# Patient Record
Sex: Female | Born: 1955
Health system: Southern US, Community
[De-identification: ages and names within clinical notes are randomized; demographics above are authoritative.]

## PROBLEM LIST (undated history)

## (undated) DIAGNOSIS — J309 Allergic rhinitis, unspecified: Secondary | ICD-10-CM

## (undated) DIAGNOSIS — R7303 Prediabetes: Secondary | ICD-10-CM

## (undated) DIAGNOSIS — M8580 Other specified disorders of bone density and structure, unspecified site: Secondary | ICD-10-CM

## (undated) DIAGNOSIS — J449 Chronic obstructive pulmonary disease, unspecified: Secondary | ICD-10-CM

## (undated) DIAGNOSIS — I10 Essential (primary) hypertension: Secondary | ICD-10-CM

## (undated) DIAGNOSIS — E782 Mixed hyperlipidemia: Secondary | ICD-10-CM

## (undated) DIAGNOSIS — K219 Gastro-esophageal reflux disease without esophagitis: Secondary | ICD-10-CM

## (undated) HISTORY — PX: EYE SURGERY: SHX253

## (undated) HISTORY — PX: OTHER SURGICAL HISTORY: SHX169

## (undated) HISTORY — DX: Chronic obstructive pulmonary disease, unspecified: J44.9

## (undated) HISTORY — PX: BLADDER SUSPENSION: SHX72

## (undated) HISTORY — DX: Allergic rhinitis, unspecified: J30.9

## (undated) HISTORY — DX: Prediabetes: R73.03

## (undated) HISTORY — DX: Other specified disorders of bone density and structure, unspecified site: M85.80

## (undated) HISTORY — PX: LIPOMA EXCISION: SHX5283

## (undated) HISTORY — DX: Essential (primary) hypertension: I10

## (undated) HISTORY — PX: CHOLECYSTECTOMY: SHX55

## (undated) HISTORY — DX: Gastro-esophageal reflux disease without esophagitis: K21.9

## (undated) HISTORY — DX: Mixed hyperlipidemia: E78.2

---

## 1998-12-13 ENCOUNTER — Other Ambulatory Visit: Admission: RE | Admit: 1998-12-13 | Discharge: 1998-12-13 | Payer: Self-pay | Admitting: Family Medicine

## 1999-11-14 ENCOUNTER — Encounter: Payer: Self-pay | Admitting: Emergency Medicine

## 1999-11-15 ENCOUNTER — Inpatient Hospital Stay (HOSPITAL_COMMUNITY): Admission: EM | Admit: 1999-11-15 | Discharge: 1999-11-17 | Payer: Self-pay | Admitting: Emergency Medicine

## 1999-11-19 ENCOUNTER — Encounter: Admission: RE | Admit: 1999-11-19 | Discharge: 1999-11-19 | Payer: Self-pay | Admitting: Family Medicine

## 1999-11-25 ENCOUNTER — Encounter: Payer: Self-pay | Admitting: Emergency Medicine

## 1999-11-25 ENCOUNTER — Inpatient Hospital Stay (HOSPITAL_COMMUNITY): Admission: EM | Admit: 1999-11-25 | Discharge: 1999-11-29 | Payer: Self-pay | Admitting: Emergency Medicine

## 1999-11-27 ENCOUNTER — Encounter: Payer: Self-pay | Admitting: Family Medicine

## 2004-12-26 ENCOUNTER — Other Ambulatory Visit: Admission: RE | Admit: 2004-12-26 | Discharge: 2004-12-26 | Payer: Self-pay | Admitting: Family Medicine

## 2005-02-07 ENCOUNTER — Emergency Department: Payer: Self-pay | Admitting: General Practice

## 2005-06-17 ENCOUNTER — Ambulatory Visit: Payer: Self-pay | Admitting: Internal Medicine

## 2005-06-25 ENCOUNTER — Ambulatory Visit (HOSPITAL_COMMUNITY): Admission: RE | Admit: 2005-06-25 | Discharge: 2005-06-25 | Payer: Self-pay | Admitting: Internal Medicine

## 2005-07-01 ENCOUNTER — Ambulatory Visit: Payer: Self-pay | Admitting: Internal Medicine

## 2005-07-03 ENCOUNTER — Ambulatory Visit: Payer: Self-pay | Admitting: Internal Medicine

## 2005-08-26 ENCOUNTER — Ambulatory Visit: Payer: Self-pay | Admitting: Internal Medicine

## 2005-08-28 ENCOUNTER — Ambulatory Visit: Payer: Self-pay | Admitting: Internal Medicine

## 2005-10-02 ENCOUNTER — Ambulatory Visit: Payer: Self-pay | Admitting: Internal Medicine

## 2005-10-29 ENCOUNTER — Ambulatory Visit: Payer: Self-pay | Admitting: Internal Medicine

## 2005-12-11 ENCOUNTER — Ambulatory Visit: Payer: Self-pay | Admitting: Internal Medicine

## 2006-01-18 ENCOUNTER — Ambulatory Visit: Payer: Self-pay | Admitting: Internal Medicine

## 2006-03-19 ENCOUNTER — Ambulatory Visit: Payer: Self-pay | Admitting: Internal Medicine

## 2006-07-29 ENCOUNTER — Ambulatory Visit: Payer: Self-pay | Admitting: Internal Medicine

## 2006-08-20 ENCOUNTER — Ambulatory Visit: Payer: Self-pay | Admitting: Internal Medicine

## 2006-09-20 ENCOUNTER — Ambulatory Visit: Payer: Self-pay | Admitting: Internal Medicine

## 2006-11-30 ENCOUNTER — Ambulatory Visit: Payer: Self-pay | Admitting: Internal Medicine

## 2006-12-18 ENCOUNTER — Other Ambulatory Visit: Payer: Self-pay

## 2006-12-19 ENCOUNTER — Inpatient Hospital Stay: Payer: Self-pay | Admitting: Internal Medicine

## 2006-12-20 ENCOUNTER — Other Ambulatory Visit: Payer: Self-pay

## 2007-03-17 ENCOUNTER — Ambulatory Visit: Payer: Self-pay | Admitting: Internal Medicine

## 2007-08-10 ENCOUNTER — Ambulatory Visit: Payer: Self-pay | Admitting: Internal Medicine

## 2007-11-07 ENCOUNTER — Encounter: Payer: Self-pay | Admitting: Internal Medicine

## 2008-01-23 ENCOUNTER — Encounter: Payer: Self-pay | Admitting: Internal Medicine

## 2008-02-15 ENCOUNTER — Ambulatory Visit: Payer: Self-pay | Admitting: Unknown Physician Specialty

## 2008-02-15 ENCOUNTER — Other Ambulatory Visit: Payer: Self-pay

## 2009-01-02 ENCOUNTER — Encounter: Payer: Self-pay | Admitting: Internal Medicine

## 2009-06-19 ENCOUNTER — Ambulatory Visit: Payer: Self-pay | Admitting: Family Medicine

## 2009-07-10 ENCOUNTER — Ambulatory Visit: Payer: Self-pay | Admitting: Gastroenterology

## 2009-11-15 ENCOUNTER — Telehealth (INDEPENDENT_AMBULATORY_CARE_PROVIDER_SITE_OTHER): Payer: Self-pay | Admitting: *Deleted

## 2010-11-01 ENCOUNTER — Inpatient Hospital Stay: Payer: Self-pay | Admitting: Internal Medicine

## 2010-11-06 LAB — PATHOLOGY REPORT

## 2010-11-25 NOTE — Progress Notes (Signed)
Summary: Record request  Request for records received from [facility] [place]. Request forwarded to [employer]. Wilder Glade  November 15, 2009 4:13 PM

## 2011-03-10 NOTE — Assessment & Plan Note (Signed)
[facility]                             PULMONARY OFFICE NOTE   Angela Pitts, Angela Pitts                   MRN:          [id number]  DATE:03/17/2007                            DOB:          April 22, 1956    PROBLEM:  1. Asthmatic bronchitis/chronic obstructive pulmonary disease.  2. Allergic asthma.  3. Allergic rhinitis.  4. Occupational asthma.   HISTORY:  Continues same job.  Remains off of cigarettes.  Hospitalized  at [place] 6 weeks ago with chest pain, for which he says was blamed on  COPD.  MI was ruled out, including heart catheterization.  She says  pollen makes her sneeze, increases chest congestion.  She was changed  from Advair to Symbicort for trial.  Overall, she feels better.  Has a  left parasternal, sharp point tenderness that she can identify with one  fingertip, associated only with forced deep breath.  Chest x-ray from  February had shown no acute pulmonary findings, mild interstitial  coarsening, which I told her was consistent with bronchitis.   MEDICATIONS:  1. Singulair 10 mg.  2. Spiriva.  3. Nasonex.  4. Zyrtec 10 mg.  5. Allergy vaccines per Dr. Harlene Salts office has continued at 1-10.  6. Oxygen 2 liters at h.s.  7. Home nebulizer with DuoNeb.  8. She is still using prednisone 10 mg daily for maintenance.  9. Lexapro 10 mg.  10.Symbicort 160/4.5.  11.Isorbid 30 mg.  12.Pro-Air rescue inhaler as available p.r.n.  13.Aciphex.  14.Nitroglycerin.   MEDICATION ALLERGIES:  NO MEDICATION ALLERGY.   OBJECTIVE:  VITAL SIGNS:  Weight 200 pounds, BP 106/70, pulse 74, room  air saturation 94%, mild congestion with diminished breath sounds.  HEART:  Sounds are normal.  HEENT:  Mild nasal stuffiness.   IMPRESSION:  1. Asthma with chronic obstructive pulmonary disease.  2. Current status is fair.  I think there is a seasonal pollen      exacerbation to some rhinitis.  The left parasternal pain is      costochondritis,  probably related to hard coughing, when she was      hospitalized a few weeks ago.   PLAN:  Environmental precautions including discussion about what she is  going to do about her job.  Continue prednisone at 10 mg daily for now,  as a way of trying to keep her functional, but we discussed steroid  issues again.  Schedule return in 3 months, earlier p.r.n.     Clinton D. Maple Hudson, MD, [facility], FACP  Electronically Signed    CDY/MedQ  DD: 03/20/2007  DT: 03/20/2007  Job #: [id number]   cc:   Lianne Bushy, M.D.

## 2011-03-13 NOTE — Assessment & Plan Note (Signed)
[facility]                             PULMONARY OFFICE NOTE   Angela Pitts, Angela Pitts                   MRN:          [id number]  DATE:12/03/2006                            DOB:          11/08/1955    PROBLEM:  1. Asthmatic bronchitis/chronic obstructive pulmonary disease.  2. Allergic asthma.  3. Allergic rhinitis.  4. Occupational asthma.   HISTORY:  She had GI upset and bronchitis a week or 2 ago with hard  coughing, and noted a streak of hemoptysis at that time.  That is now  substantially resolved.  There has been no epistaxis, no other bleeding,  no ankle edema or chest pain.  She continues allergy vaccine at 1:50  given at Dr. Harlene Salts office.  She always wheezes.  She has not been  successful at finding a different job, although we have discussed the  impact of her workplace on her breathing.   MEDICATIONS:  1. Advair 250/50.  2. Singulair 10 mg.  3. Spiriva.  4. Nasonex.  5. Zyrtec 10 mg.  6. Allergy vaccine.  7. Oxygen 2 L at night.  8. Home nebulizer with DuoNeb.  9. Rescue albuterol inhaler.  10.AcipHex p.r.n. use.   No medication allergy.   She has remained off of cigarettes.   OBJECTIVE:  Weight 192 pounds, BP 110/80, pulse regular 100, room air  saturation 91%.  Diffuse wheeze, unlabored.  Heart sounds regular without murmur.  No neck vein distension or edema.  No cyanosis, no adenopathy.   IMPRESSION:  Asthmatic bronchitis/chronic obstructive pulmonary disease  with recent exacerbation that sounds like a viral episode on top of her  baseline chronic asthmatic bronchitis.  I have again discussed job  change or disability application.   PLAN:  Chest x-ray.  Maintenance prednisone 10 mg daily for a trial with  steroid talk.  Schedule return 3 months.  Consider then advancing  vaccine to 1:10.     Clinton D. Maple Hudson, MD, [facility], FACP  Electronically Signed    CDY/MedQ  DD: 12/04/2006  DT: 12/04/2006  Job #:  [id number]   cc:   Lianne Bushy, M.D.

## 2011-03-13 NOTE — Assessment & Plan Note (Signed)
[facility]                               PULMONARY OFFICE NOTE   Angela, Pitts                   MRN:          [id number]  DATE:08/20/2006                            DOB:          1955/12/12    PULMONARY FOLLOWUP   PROBLEMS:  1. Asthmatic bronchitis/chronic obstructive pulmonary disease.  2. Allergic asthma.  3. Allergic rhinitis.  4. Occupational asthma.   HISTORY:  Has had flu shot.  Off of prednisone.  Got a local reddening at  her allergy shot injection site last time, placed unusually far back and low  on the triceps area.  Previous shots have been over the deltoid with no  problems at same dose.  This is resolving.  She always has some nasal and  chest congestion, does not feel she has a cold.  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  I have again  emphasized the dominant importance of respiratory irritants in keeping her  problems going.   MEDICATIONS:  1. Advair 250/50.  2. Singulair 10 mg.  3. Spiriva.  4. Nasonex.  5. Zyrtec.  6. Allergy vaccine.  7. Oxygen 2L used at night.  8. Home nebulizer with DuoNeb used 2 or 3 times a week.  9. Rescue albuterol inhaler.  10.Occasional AcipHex.   OBJECTIVE:  Weight 199 pounds.  BP 120/76, pulse regular 80, room air  saturation 91%.  There is moderate nasal congestion with clear secretion.  Chest congestion with cough.  Unlabored, no dullness.  Heart sounds are regular.   IMPRESSION:  1. Asthmatic bronchitis/chronic obstructive pulmonary disease with an      occupational asthma component.  2. Local reaction to allergy vaccine.  We have discussed management.  I am      going to make sure she is stable at 1:50 before we consider advancing      her strength.  3. Nasal congestion and rhinitis, at least partly irritant and partly      allergic.   PLAN:  1. Nebulizer treatment here, Xopenex 1.25 mg, Depo-Medrol 80 mg IM.  2.  Hold vaccine at 1:50.  Rotate injection site.  Watch technique and      watch for further local      reaction.  3. Schedule return in 3 months, earlier p.r.n.     Clinton D. Maple Hudson, MD, [facility], FACP    CDY/MedQ  DD: 08/22/2006  DT: 08/23/2006  Job #: [id number]   cc:   Lianne Bushy, M.D.

## 2011-03-13 NOTE — Assessment & Plan Note (Signed)
Angela Pitts                               PULMONARY OFFICE NOTE   Angela, [name]                   MRN:          [id number]  DATE:07/29/2006                            DOB:          November 27, 1955    PROBLEM LIST:  1. Asthmatic bronchitis/chronic obstructive pulmonary disease.  2. Allergic asthma.  3. Allergic rhinitis.  4. Occupational asthma.   HISTORY OF PRESENT ILLNESS:  She is not smoking but has pretty constant  second hand smoke exposure around the family and co-workers. In the last 2  weeks, she has had increased nasal congestion with a white discharge. She  coughs until she blows her nose and gets this cleared out and feels she is  doing quite well. She continues oxygen 2 liters at night with regular use of  Nasonex and Zyrtec. She is working full time and pretty definitely  recognizes that her cough is better when she is away from the work place.  She is still looking for an alternative job and volunteers that her cough is  worse by the end of each work week and begins to get better over the  weekends. They let her take her nebulizer machine to work, which is a help  because it lets her keep working. She is getting her allergy vaccine once a  week at 1 to 50 at Dr. Harlene Pitts office, with no problems at all.   MEDICATIONS:  Advair 250/50, Singulair 10 mg, Spiriva, Nasonex, Zyrtec 10  mg, allergy vaccine, oxygen at 2 liters, Duo-Neb via nebulizer 2 to 3 times  a week p.r.n. up to q.i.d. p.r.n., rescue albuterol inhaler, Aciphex 20 mg  p.r.n. She has been on maintenance prednisone and I realized today that I am  not clear, as I did not ask while she was here, whether she is still doing  that at 10 mg daily.   OBJECTIVE:  VITAL SIGNS:  Weight 203 pounds. Blood pressure 122/82, pulse  regular at 79. Room air saturation 955.  LUNGS:  Inspiratory and expiratory wheezy congested cough, unlabored. No  stridor.  HEART:  Sounds regular  without murmur or gallop.  HEENT:  Turbinate edema.  EXTREMITIES:  No peripheral edema or cyanosis.   IMPRESSION:  Allergic and occupational rhinitis and asthma with chronic  obstructive pulmonary disease.   PLAN:  1. Continued effort to avoid respiratory irritants and the occupational      fumes. I have encouraged her to try a little harder to find herself an      alternative job. Continue vaccine at dose advance to 1 to 10.      Laboratory, will send instructions to Dr. Harlene Pitts office.  2. Chest x-ray.  3. Flu vaccine is given today.  4. Schedule return in 6 months, earlier p.r.n.       Angela D. Maple Hudson, MD, [facility], FACP      CDY/MedQ  DD:  07/29/2006  DT:  08/01/2006  Job #:  [id number]   cc:   Angela Pitts, M.D.

## 2014-11-17 DIAGNOSIS — J209 Acute bronchitis, unspecified: Secondary | ICD-10-CM | POA: Diagnosis not present

## 2014-12-18 DIAGNOSIS — J209 Acute bronchitis, unspecified: Secondary | ICD-10-CM | POA: Diagnosis not present

## 2015-01-16 DIAGNOSIS — J209 Acute bronchitis, unspecified: Secondary | ICD-10-CM | POA: Diagnosis not present

## 2015-02-16 DIAGNOSIS — J209 Acute bronchitis, unspecified: Secondary | ICD-10-CM | POA: Diagnosis not present

## 2015-03-18 DIAGNOSIS — J209 Acute bronchitis, unspecified: Secondary | ICD-10-CM | POA: Diagnosis not present

## 2015-04-04 DIAGNOSIS — M25561 Pain in right knee: Secondary | ICD-10-CM | POA: Diagnosis not present

## 2015-04-04 DIAGNOSIS — J449 Chronic obstructive pulmonary disease, unspecified: Secondary | ICD-10-CM | POA: Diagnosis not present

## 2015-04-04 DIAGNOSIS — E119 Type 2 diabetes mellitus without complications: Secondary | ICD-10-CM | POA: Diagnosis not present

## 2015-04-04 DIAGNOSIS — M1711 Unilateral primary osteoarthritis, right knee: Secondary | ICD-10-CM | POA: Diagnosis not present

## 2015-04-04 DIAGNOSIS — I10 Essential (primary) hypertension: Secondary | ICD-10-CM | POA: Diagnosis not present

## 2015-04-18 DIAGNOSIS — J209 Acute bronchitis, unspecified: Secondary | ICD-10-CM | POA: Diagnosis not present

## 2015-05-08 DIAGNOSIS — R7309 Other abnormal glucose: Secondary | ICD-10-CM | POA: Diagnosis not present

## 2015-05-08 DIAGNOSIS — Z6841 Body Mass Index (BMI) 40.0 and over, adult: Secondary | ICD-10-CM | POA: Diagnosis not present

## 2015-05-08 DIAGNOSIS — E782 Mixed hyperlipidemia: Secondary | ICD-10-CM | POA: Diagnosis not present

## 2015-05-08 DIAGNOSIS — Z720 Tobacco use: Secondary | ICD-10-CM | POA: Diagnosis not present

## 2015-05-08 DIAGNOSIS — J309 Allergic rhinitis, unspecified: Secondary | ICD-10-CM | POA: Diagnosis not present

## 2015-05-08 DIAGNOSIS — J449 Chronic obstructive pulmonary disease, unspecified: Secondary | ICD-10-CM | POA: Diagnosis not present

## 2015-05-08 DIAGNOSIS — I10 Essential (primary) hypertension: Secondary | ICD-10-CM | POA: Diagnosis not present

## 2015-05-18 DIAGNOSIS — J209 Acute bronchitis, unspecified: Secondary | ICD-10-CM | POA: Diagnosis not present

## 2015-06-06 ENCOUNTER — Telehealth: Payer: Self-pay

## 2015-06-06 DIAGNOSIS — Z124 Encounter for screening for malignant neoplasm of cervix: Secondary | ICD-10-CM | POA: Diagnosis not present

## 2015-06-06 DIAGNOSIS — J449 Chronic obstructive pulmonary disease, unspecified: Secondary | ICD-10-CM | POA: Diagnosis not present

## 2015-06-06 DIAGNOSIS — Z0000 Encounter for general adult medical examination without abnormal findings: Secondary | ICD-10-CM | POA: Diagnosis not present

## 2015-06-06 DIAGNOSIS — Z720 Tobacco use: Secondary | ICD-10-CM | POA: Diagnosis not present

## 2015-06-06 DIAGNOSIS — Z6841 Body Mass Index (BMI) 40.0 and over, adult: Secondary | ICD-10-CM | POA: Diagnosis not present

## 2015-06-06 DIAGNOSIS — K5900 Constipation, unspecified: Secondary | ICD-10-CM | POA: Diagnosis not present

## 2015-06-06 NOTE — Telephone Encounter (Signed)
Called and spoke with Vaughan Basta from [facility] her that there was availability at [place] location on 06/18/15. Pt would need to arrive at 10:15 for 10:30 appt with PW Per Vaughan Basta, she stated that pt was still at their office and that it was ok for pt to be scheduled at this time. Vaughan Basta also stated that she would be faxing office notes to [place] office for review  Pt has been scheduled  Nothing further is needed at this time

## 2015-06-10 DIAGNOSIS — K219 Gastro-esophageal reflux disease without esophagitis: Secondary | ICD-10-CM | POA: Diagnosis not present

## 2015-06-10 DIAGNOSIS — J309 Allergic rhinitis, unspecified: Secondary | ICD-10-CM | POA: Diagnosis not present

## 2015-06-10 DIAGNOSIS — J449 Chronic obstructive pulmonary disease, unspecified: Secondary | ICD-10-CM | POA: Diagnosis not present

## 2015-06-10 DIAGNOSIS — I10 Essential (primary) hypertension: Secondary | ICD-10-CM | POA: Diagnosis not present

## 2015-06-17 ENCOUNTER — Encounter: Payer: Self-pay | Admitting: Critical Care Medicine

## 2015-06-18 ENCOUNTER — Institutional Professional Consult (permissible substitution): Payer: Self-pay | Admitting: Critical Care Medicine

## 2015-06-18 DIAGNOSIS — J209 Acute bronchitis, unspecified: Secondary | ICD-10-CM | POA: Diagnosis not present

## 2015-07-02 DIAGNOSIS — Z1231 Encounter for screening mammogram for malignant neoplasm of breast: Secondary | ICD-10-CM | POA: Diagnosis not present

## 2015-07-19 DIAGNOSIS — J209 Acute bronchitis, unspecified: Secondary | ICD-10-CM | POA: Diagnosis not present

## 2015-08-18 DIAGNOSIS — J209 Acute bronchitis, unspecified: Secondary | ICD-10-CM | POA: Diagnosis not present

## 2015-09-02 ENCOUNTER — Encounter: Payer: Self-pay | Admitting: Internal Medicine

## 2015-09-18 DIAGNOSIS — J209 Acute bronchitis, unspecified: Secondary | ICD-10-CM | POA: Diagnosis not present

## 2015-10-03 ENCOUNTER — Encounter: Payer: Self-pay | Admitting: Internal Medicine

## 2015-10-18 DIAGNOSIS — J209 Acute bronchitis, unspecified: Secondary | ICD-10-CM | POA: Diagnosis not present

## 2015-11-18 DIAGNOSIS — J209 Acute bronchitis, unspecified: Secondary | ICD-10-CM | POA: Diagnosis not present

## 2015-12-19 DIAGNOSIS — J209 Acute bronchitis, unspecified: Secondary | ICD-10-CM | POA: Diagnosis not present

## 2016-01-16 DIAGNOSIS — J209 Acute bronchitis, unspecified: Secondary | ICD-10-CM | POA: Diagnosis not present

## 2016-02-16 DIAGNOSIS — J209 Acute bronchitis, unspecified: Secondary | ICD-10-CM | POA: Diagnosis not present

## 2016-03-17 DIAGNOSIS — J209 Acute bronchitis, unspecified: Secondary | ICD-10-CM | POA: Diagnosis not present

## 2016-03-24 DIAGNOSIS — Z79899 Other long term (current) drug therapy: Secondary | ICD-10-CM | POA: Diagnosis not present

## 2016-03-24 DIAGNOSIS — Z6841 Body Mass Index (BMI) 40.0 and over, adult: Secondary | ICD-10-CM | POA: Diagnosis not present

## 2016-03-24 DIAGNOSIS — K219 Gastro-esophageal reflux disease without esophagitis: Secondary | ICD-10-CM | POA: Diagnosis not present

## 2016-03-24 DIAGNOSIS — E782 Mixed hyperlipidemia: Secondary | ICD-10-CM | POA: Diagnosis not present

## 2016-03-24 DIAGNOSIS — I10 Essential (primary) hypertension: Secondary | ICD-10-CM | POA: Diagnosis not present

## 2016-03-24 DIAGNOSIS — Z87891 Personal history of nicotine dependence: Secondary | ICD-10-CM | POA: Diagnosis not present

## 2016-03-24 DIAGNOSIS — R7303 Prediabetes: Secondary | ICD-10-CM | POA: Diagnosis not present

## 2016-03-24 DIAGNOSIS — Z1389 Encounter for screening for other disorder: Secondary | ICD-10-CM | POA: Diagnosis not present

## 2016-03-24 DIAGNOSIS — J449 Chronic obstructive pulmonary disease, unspecified: Secondary | ICD-10-CM | POA: Diagnosis not present

## 2016-03-24 DIAGNOSIS — E2839 Other primary ovarian failure: Secondary | ICD-10-CM | POA: Diagnosis not present

## 2016-04-03 DIAGNOSIS — M8580 Other specified disorders of bone density and structure, unspecified site: Secondary | ICD-10-CM | POA: Diagnosis not present

## 2016-04-03 DIAGNOSIS — M8588 Other specified disorders of bone density and structure, other site: Secondary | ICD-10-CM | POA: Diagnosis not present

## 2016-04-03 DIAGNOSIS — E2839 Other primary ovarian failure: Secondary | ICD-10-CM | POA: Diagnosis not present

## 2016-04-06 DIAGNOSIS — R918 Other nonspecific abnormal finding of lung field: Secondary | ICD-10-CM | POA: Diagnosis not present

## 2016-04-06 DIAGNOSIS — Z87891 Personal history of nicotine dependence: Secondary | ICD-10-CM | POA: Diagnosis not present

## 2016-04-14 DIAGNOSIS — E875 Hyperkalemia: Secondary | ICD-10-CM | POA: Diagnosis not present

## 2016-04-17 DIAGNOSIS — J209 Acute bronchitis, unspecified: Secondary | ICD-10-CM | POA: Diagnosis not present

## 2016-04-29 DIAGNOSIS — J449 Chronic obstructive pulmonary disease, unspecified: Secondary | ICD-10-CM | POA: Diagnosis not present

## 2016-04-29 DIAGNOSIS — R918 Other nonspecific abnormal finding of lung field: Secondary | ICD-10-CM | POA: Diagnosis not present

## 2016-04-29 DIAGNOSIS — R0602 Shortness of breath: Secondary | ICD-10-CM | POA: Diagnosis not present

## 2016-04-29 DIAGNOSIS — J479 Bronchiectasis, uncomplicated: Secondary | ICD-10-CM | POA: Diagnosis not present

## 2016-05-04 DIAGNOSIS — R918 Other nonspecific abnormal finding of lung field: Secondary | ICD-10-CM | POA: Diagnosis not present

## 2016-05-04 DIAGNOSIS — R05 Cough: Secondary | ICD-10-CM | POA: Diagnosis not present

## 2016-05-04 DIAGNOSIS — R0602 Shortness of breath: Secondary | ICD-10-CM | POA: Diagnosis not present

## 2016-05-17 DIAGNOSIS — J209 Acute bronchitis, unspecified: Secondary | ICD-10-CM | POA: Diagnosis not present

## 2016-06-17 DIAGNOSIS — J209 Acute bronchitis, unspecified: Secondary | ICD-10-CM | POA: Diagnosis not present

## 2016-07-18 DIAGNOSIS — J209 Acute bronchitis, unspecified: Secondary | ICD-10-CM | POA: Diagnosis not present

## 2016-08-17 DIAGNOSIS — J209 Acute bronchitis, unspecified: Secondary | ICD-10-CM | POA: Diagnosis not present

## 2016-08-18 DIAGNOSIS — Z1231 Encounter for screening mammogram for malignant neoplasm of breast: Secondary | ICD-10-CM | POA: Diagnosis not present

## 2016-09-17 DIAGNOSIS — J209 Acute bronchitis, unspecified: Secondary | ICD-10-CM | POA: Diagnosis not present

## 2016-10-17 DIAGNOSIS — J209 Acute bronchitis, unspecified: Secondary | ICD-10-CM | POA: Diagnosis not present

## 2016-11-03 ENCOUNTER — Other Ambulatory Visit: Payer: Self-pay | Admitting: Specialist

## 2016-11-03 DIAGNOSIS — R05 Cough: Secondary | ICD-10-CM | POA: Diagnosis not present

## 2016-11-03 DIAGNOSIS — R918 Other nonspecific abnormal finding of lung field: Secondary | ICD-10-CM

## 2016-11-03 DIAGNOSIS — R059 Cough, unspecified: Secondary | ICD-10-CM

## 2016-11-03 DIAGNOSIS — Z6841 Body Mass Index (BMI) 40.0 and over, adult: Secondary | ICD-10-CM | POA: Diagnosis not present

## 2016-11-03 DIAGNOSIS — J439 Emphysema, unspecified: Secondary | ICD-10-CM | POA: Diagnosis not present

## 2016-11-03 DIAGNOSIS — J310 Chronic rhinitis: Secondary | ICD-10-CM | POA: Diagnosis not present

## 2016-11-17 DIAGNOSIS — J209 Acute bronchitis, unspecified: Secondary | ICD-10-CM | POA: Diagnosis not present

## 2016-12-18 DIAGNOSIS — J209 Acute bronchitis, unspecified: Secondary | ICD-10-CM | POA: Diagnosis not present

## 2017-01-15 DIAGNOSIS — J209 Acute bronchitis, unspecified: Secondary | ICD-10-CM | POA: Diagnosis not present

## 2017-01-31 DIAGNOSIS — Z9981 Dependence on supplemental oxygen: Secondary | ICD-10-CM | POA: Diagnosis not present

## 2017-01-31 DIAGNOSIS — R531 Weakness: Secondary | ICD-10-CM | POA: Diagnosis not present

## 2017-01-31 DIAGNOSIS — J441 Chronic obstructive pulmonary disease with (acute) exacerbation: Secondary | ICD-10-CM | POA: Diagnosis not present

## 2017-01-31 DIAGNOSIS — I10 Essential (primary) hypertension: Secondary | ICD-10-CM | POA: Diagnosis not present

## 2017-01-31 DIAGNOSIS — Z79899 Other long term (current) drug therapy: Secondary | ICD-10-CM | POA: Diagnosis not present

## 2017-01-31 DIAGNOSIS — R05 Cough: Secondary | ICD-10-CM | POA: Diagnosis not present

## 2017-01-31 DIAGNOSIS — R42 Dizziness and giddiness: Secondary | ICD-10-CM | POA: Diagnosis not present

## 2017-01-31 DIAGNOSIS — R0602 Shortness of breath: Secondary | ICD-10-CM | POA: Diagnosis not present

## 2017-01-31 DIAGNOSIS — F17210 Nicotine dependence, cigarettes, uncomplicated: Secondary | ICD-10-CM | POA: Diagnosis not present

## 2017-02-15 DIAGNOSIS — J209 Acute bronchitis, unspecified: Secondary | ICD-10-CM | POA: Diagnosis not present

## 2017-02-23 DIAGNOSIS — J310 Chronic rhinitis: Secondary | ICD-10-CM | POA: Diagnosis not present

## 2017-02-23 DIAGNOSIS — J449 Chronic obstructive pulmonary disease, unspecified: Secondary | ICD-10-CM | POA: Diagnosis not present

## 2017-02-23 DIAGNOSIS — R918 Other nonspecific abnormal finding of lung field: Secondary | ICD-10-CM | POA: Diagnosis not present

## 2017-02-23 DIAGNOSIS — R0609 Other forms of dyspnea: Secondary | ICD-10-CM | POA: Diagnosis not present

## 2017-02-23 DIAGNOSIS — R05 Cough: Secondary | ICD-10-CM | POA: Diagnosis not present

## 2017-03-03 DIAGNOSIS — R899 Unspecified abnormal finding in specimens from other organs, systems and tissues: Secondary | ICD-10-CM | POA: Diagnosis not present

## 2017-03-03 DIAGNOSIS — J449 Chronic obstructive pulmonary disease, unspecified: Secondary | ICD-10-CM | POA: Insufficient documentation

## 2017-03-03 DIAGNOSIS — R768 Other specified abnormal immunological findings in serum: Secondary | ICD-10-CM | POA: Insufficient documentation

## 2017-03-03 DIAGNOSIS — R938 Abnormal findings on diagnostic imaging of other specified body structures: Secondary | ICD-10-CM | POA: Diagnosis not present

## 2017-03-03 DIAGNOSIS — M1712 Unilateral primary osteoarthritis, left knee: Secondary | ICD-10-CM | POA: Diagnosis not present

## 2017-03-03 DIAGNOSIS — R9389 Abnormal findings on diagnostic imaging of other specified body structures: Secondary | ICD-10-CM | POA: Insufficient documentation

## 2017-03-17 DIAGNOSIS — J209 Acute bronchitis, unspecified: Secondary | ICD-10-CM | POA: Diagnosis not present

## 2017-04-02 ENCOUNTER — Ambulatory Visit
Admission: RE | Admit: 2017-04-02 | Discharge: 2017-04-02 | Disposition: A | Discharge status: Home / Self Care | Payer: Medicare HMO | Source: Ambulatory Visit | Attending: Specialist | Admitting: Specialist

## 2017-04-02 DIAGNOSIS — R05 Cough: Secondary | ICD-10-CM | POA: Insufficient documentation

## 2017-04-02 DIAGNOSIS — R918 Other nonspecific abnormal finding of lung field: Secondary | ICD-10-CM | POA: Diagnosis not present

## 2017-04-02 DIAGNOSIS — R0602 Shortness of breath: Secondary | ICD-10-CM | POA: Diagnosis not present

## 2017-04-02 DIAGNOSIS — R059 Cough, unspecified: Secondary | ICD-10-CM

## 2017-04-17 DIAGNOSIS — J209 Acute bronchitis, unspecified: Secondary | ICD-10-CM | POA: Diagnosis not present

## 2017-05-17 DIAGNOSIS — J209 Acute bronchitis, unspecified: Secondary | ICD-10-CM | POA: Diagnosis not present

## 2017-06-17 DIAGNOSIS — J209 Acute bronchitis, unspecified: Secondary | ICD-10-CM | POA: Diagnosis not present

## 2017-06-29 DIAGNOSIS — R0609 Other forms of dyspnea: Secondary | ICD-10-CM | POA: Diagnosis not present

## 2017-06-29 DIAGNOSIS — R05 Cough: Secondary | ICD-10-CM | POA: Diagnosis not present

## 2017-06-29 DIAGNOSIS — J439 Emphysema, unspecified: Secondary | ICD-10-CM | POA: Diagnosis not present

## 2017-07-18 DIAGNOSIS — J209 Acute bronchitis, unspecified: Secondary | ICD-10-CM | POA: Diagnosis not present

## 2017-08-17 DIAGNOSIS — J209 Acute bronchitis, unspecified: Secondary | ICD-10-CM | POA: Diagnosis not present

## 2017-09-17 DIAGNOSIS — J209 Acute bronchitis, unspecified: Secondary | ICD-10-CM | POA: Diagnosis not present

## 2017-10-17 DIAGNOSIS — J209 Acute bronchitis, unspecified: Secondary | ICD-10-CM | POA: Diagnosis not present

## 2017-11-02 DIAGNOSIS — R69 Illness, unspecified: Secondary | ICD-10-CM | POA: Diagnosis not present

## 2017-11-17 DIAGNOSIS — J209 Acute bronchitis, unspecified: Secondary | ICD-10-CM | POA: Diagnosis not present

## 2017-12-01 DIAGNOSIS — J189 Pneumonia, unspecified organism: Secondary | ICD-10-CM | POA: Diagnosis not present

## 2017-12-01 DIAGNOSIS — J449 Chronic obstructive pulmonary disease, unspecified: Secondary | ICD-10-CM | POA: Diagnosis not present

## 2017-12-01 DIAGNOSIS — Z6841 Body Mass Index (BMI) 40.0 and over, adult: Secondary | ICD-10-CM | POA: Diagnosis not present

## 2017-12-18 DIAGNOSIS — J209 Acute bronchitis, unspecified: Secondary | ICD-10-CM | POA: Diagnosis not present

## 2018-01-15 DIAGNOSIS — J209 Acute bronchitis, unspecified: Secondary | ICD-10-CM | POA: Diagnosis not present

## 2018-01-23 DIAGNOSIS — R69 Illness, unspecified: Secondary | ICD-10-CM | POA: Diagnosis not present

## 2018-02-15 DIAGNOSIS — J209 Acute bronchitis, unspecified: Secondary | ICD-10-CM | POA: Diagnosis not present

## 2018-03-16 DIAGNOSIS — R69 Illness, unspecified: Secondary | ICD-10-CM | POA: Diagnosis not present

## 2018-03-17 DIAGNOSIS — J209 Acute bronchitis, unspecified: Secondary | ICD-10-CM | POA: Diagnosis not present

## 2018-04-14 DIAGNOSIS — R69 Illness, unspecified: Secondary | ICD-10-CM | POA: Diagnosis not present

## 2018-04-17 DIAGNOSIS — J209 Acute bronchitis, unspecified: Secondary | ICD-10-CM | POA: Diagnosis not present

## 2018-04-22 ENCOUNTER — Other Ambulatory Visit: Payer: Self-pay | Admitting: Specialist

## 2018-04-22 DIAGNOSIS — R911 Solitary pulmonary nodule: Secondary | ICD-10-CM | POA: Diagnosis not present

## 2018-04-22 DIAGNOSIS — J439 Emphysema, unspecified: Secondary | ICD-10-CM | POA: Diagnosis not present

## 2018-04-22 DIAGNOSIS — J449 Chronic obstructive pulmonary disease, unspecified: Secondary | ICD-10-CM | POA: Diagnosis not present

## 2018-04-22 DIAGNOSIS — R0609 Other forms of dyspnea: Secondary | ICD-10-CM | POA: Diagnosis not present

## 2018-04-22 DIAGNOSIS — J310 Chronic rhinitis: Secondary | ICD-10-CM | POA: Diagnosis not present

## 2018-04-22 DIAGNOSIS — E668 Other obesity: Secondary | ICD-10-CM | POA: Diagnosis not present

## 2018-04-22 DIAGNOSIS — R05 Cough: Secondary | ICD-10-CM | POA: Diagnosis not present

## 2018-05-05 ENCOUNTER — Other Ambulatory Visit: Payer: Self-pay | Admitting: Specialist

## 2018-05-05 DIAGNOSIS — R911 Solitary pulmonary nodule: Secondary | ICD-10-CM

## 2018-05-06 ENCOUNTER — Other Ambulatory Visit: Payer: Self-pay | Admitting: Specialist

## 2018-05-06 ENCOUNTER — Ambulatory Visit
Admission: RE | Admit: 2018-05-06 | Discharge: 2018-05-06 | Disposition: A | Discharge status: Home / Self Care | Payer: Medicare HMO | Source: Ambulatory Visit | Attending: Specialist | Admitting: Specialist

## 2018-05-06 DIAGNOSIS — J449 Chronic obstructive pulmonary disease, unspecified: Secondary | ICD-10-CM | POA: Insufficient documentation

## 2018-05-06 DIAGNOSIS — I700 Atherosclerosis of aorta: Secondary | ICD-10-CM | POA: Diagnosis not present

## 2018-05-06 DIAGNOSIS — R911 Solitary pulmonary nodule: Secondary | ICD-10-CM

## 2018-05-06 DIAGNOSIS — R918 Other nonspecific abnormal finding of lung field: Secondary | ICD-10-CM | POA: Insufficient documentation

## 2018-05-06 DIAGNOSIS — R0602 Shortness of breath: Secondary | ICD-10-CM | POA: Insufficient documentation

## 2018-05-06 DIAGNOSIS — I2510 Atherosclerotic heart disease of native coronary artery without angina pectoris: Secondary | ICD-10-CM | POA: Insufficient documentation

## 2018-05-10 DIAGNOSIS — R69 Illness, unspecified: Secondary | ICD-10-CM | POA: Diagnosis not present

## 2018-05-11 DIAGNOSIS — K5190 Ulcerative colitis, unspecified, without complications: Secondary | ICD-10-CM | POA: Diagnosis not present

## 2018-05-11 DIAGNOSIS — G8929 Other chronic pain: Secondary | ICD-10-CM | POA: Diagnosis not present

## 2018-05-11 DIAGNOSIS — Z6841 Body Mass Index (BMI) 40.0 and over, adult: Secondary | ICD-10-CM | POA: Diagnosis not present

## 2018-05-11 DIAGNOSIS — I25119 Atherosclerotic heart disease of native coronary artery with unspecified angina pectoris: Secondary | ICD-10-CM | POA: Diagnosis not present

## 2018-05-11 DIAGNOSIS — R69 Illness, unspecified: Secondary | ICD-10-CM | POA: Diagnosis not present

## 2018-05-11 DIAGNOSIS — J449 Chronic obstructive pulmonary disease, unspecified: Secondary | ICD-10-CM | POA: Diagnosis not present

## 2018-05-11 DIAGNOSIS — E785 Hyperlipidemia, unspecified: Secondary | ICD-10-CM | POA: Diagnosis not present

## 2018-05-11 DIAGNOSIS — H269 Unspecified cataract: Secondary | ICD-10-CM | POA: Diagnosis not present

## 2018-05-11 DIAGNOSIS — H547 Unspecified visual loss: Secondary | ICD-10-CM | POA: Diagnosis not present

## 2018-05-17 DIAGNOSIS — J209 Acute bronchitis, unspecified: Secondary | ICD-10-CM | POA: Diagnosis not present

## 2018-05-27 DIAGNOSIS — R69 Illness, unspecified: Secondary | ICD-10-CM | POA: Diagnosis not present

## 2018-06-17 DIAGNOSIS — J209 Acute bronchitis, unspecified: Secondary | ICD-10-CM | POA: Diagnosis not present

## 2018-07-18 DIAGNOSIS — J209 Acute bronchitis, unspecified: Secondary | ICD-10-CM | POA: Diagnosis not present

## 2018-08-01 DIAGNOSIS — Z23 Encounter for immunization: Secondary | ICD-10-CM | POA: Diagnosis not present

## 2018-08-01 DIAGNOSIS — E782 Mixed hyperlipidemia: Secondary | ICD-10-CM | POA: Diagnosis not present

## 2018-08-01 DIAGNOSIS — R7303 Prediabetes: Secondary | ICD-10-CM | POA: Diagnosis not present

## 2018-08-01 DIAGNOSIS — Z87891 Personal history of nicotine dependence: Secondary | ICD-10-CM | POA: Diagnosis not present

## 2018-08-01 DIAGNOSIS — Z1339 Encounter for screening examination for other mental health and behavioral disorders: Secondary | ICD-10-CM | POA: Diagnosis not present

## 2018-08-01 DIAGNOSIS — Z79899 Other long term (current) drug therapy: Secondary | ICD-10-CM | POA: Diagnosis not present

## 2018-08-01 DIAGNOSIS — J449 Chronic obstructive pulmonary disease, unspecified: Secondary | ICD-10-CM | POA: Diagnosis not present

## 2018-08-01 DIAGNOSIS — Z1331 Encounter for screening for depression: Secondary | ICD-10-CM | POA: Diagnosis not present

## 2018-08-01 DIAGNOSIS — K219 Gastro-esophageal reflux disease without esophagitis: Secondary | ICD-10-CM | POA: Diagnosis not present

## 2018-08-01 DIAGNOSIS — I10 Essential (primary) hypertension: Secondary | ICD-10-CM | POA: Diagnosis not present

## 2018-08-09 DIAGNOSIS — R69 Illness, unspecified: Secondary | ICD-10-CM | POA: Diagnosis not present

## 2018-08-17 DIAGNOSIS — J209 Acute bronchitis, unspecified: Secondary | ICD-10-CM | POA: Diagnosis not present

## 2018-08-26 DIAGNOSIS — M85852 Other specified disorders of bone density and structure, left thigh: Secondary | ICD-10-CM | POA: Diagnosis not present

## 2018-08-26 DIAGNOSIS — Z1231 Encounter for screening mammogram for malignant neoplasm of breast: Secondary | ICD-10-CM | POA: Diagnosis not present

## 2018-08-26 DIAGNOSIS — E2839 Other primary ovarian failure: Secondary | ICD-10-CM | POA: Diagnosis not present

## 2018-09-02 DIAGNOSIS — N6310 Unspecified lump in the right breast, unspecified quadrant: Secondary | ICD-10-CM | POA: Diagnosis not present

## 2018-09-02 DIAGNOSIS — R928 Other abnormal and inconclusive findings on diagnostic imaging of breast: Secondary | ICD-10-CM | POA: Diagnosis not present

## 2018-09-17 DIAGNOSIS — J209 Acute bronchitis, unspecified: Secondary | ICD-10-CM | POA: Diagnosis not present

## 2018-09-30 DIAGNOSIS — Z9981 Dependence on supplemental oxygen: Secondary | ICD-10-CM | POA: Diagnosis not present

## 2018-09-30 DIAGNOSIS — J449 Chronic obstructive pulmonary disease, unspecified: Secondary | ICD-10-CM | POA: Diagnosis not present

## 2018-09-30 DIAGNOSIS — R05 Cough: Secondary | ICD-10-CM | POA: Diagnosis not present

## 2018-09-30 DIAGNOSIS — Z6841 Body Mass Index (BMI) 40.0 and over, adult: Secondary | ICD-10-CM | POA: Diagnosis not present

## 2018-09-30 DIAGNOSIS — R0609 Other forms of dyspnea: Secondary | ICD-10-CM | POA: Diagnosis not present

## 2018-10-17 DIAGNOSIS — J209 Acute bronchitis, unspecified: Secondary | ICD-10-CM | POA: Diagnosis not present

## 2018-11-04 DIAGNOSIS — R69 Illness, unspecified: Secondary | ICD-10-CM | POA: Diagnosis not present

## 2018-11-17 DIAGNOSIS — J209 Acute bronchitis, unspecified: Secondary | ICD-10-CM | POA: Diagnosis not present

## 2018-12-18 DIAGNOSIS — J209 Acute bronchitis, unspecified: Secondary | ICD-10-CM | POA: Diagnosis not present

## 2019-01-16 DIAGNOSIS — J209 Acute bronchitis, unspecified: Secondary | ICD-10-CM | POA: Diagnosis not present

## 2019-01-31 DIAGNOSIS — J309 Allergic rhinitis, unspecified: Secondary | ICD-10-CM | POA: Diagnosis not present

## 2019-01-31 DIAGNOSIS — E782 Mixed hyperlipidemia: Secondary | ICD-10-CM | POA: Diagnosis not present

## 2019-01-31 DIAGNOSIS — Z6841 Body Mass Index (BMI) 40.0 and over, adult: Secondary | ICD-10-CM | POA: Diagnosis not present

## 2019-01-31 DIAGNOSIS — K219 Gastro-esophageal reflux disease without esophagitis: Secondary | ICD-10-CM | POA: Diagnosis not present

## 2019-01-31 DIAGNOSIS — R7303 Prediabetes: Secondary | ICD-10-CM | POA: Diagnosis not present

## 2019-01-31 DIAGNOSIS — I10 Essential (primary) hypertension: Secondary | ICD-10-CM | POA: Diagnosis not present

## 2019-01-31 DIAGNOSIS — J449 Chronic obstructive pulmonary disease, unspecified: Secondary | ICD-10-CM | POA: Diagnosis not present

## 2019-02-04 DIAGNOSIS — R69 Illness, unspecified: Secondary | ICD-10-CM | POA: Diagnosis not present

## 2019-02-16 DIAGNOSIS — J209 Acute bronchitis, unspecified: Secondary | ICD-10-CM | POA: Diagnosis not present

## 2019-02-20 IMAGING — CT CT CHEST W/O CM
2 of 3 series · 15 of 36 positions shown, 18 images · non-contrast
Comparison: 04/02/2017 and 04/06/2016

CLINICAL DATA: Follow-up indeterminate pulmonary nodules.

EXAM:
CT CHEST WITHOUT CONTRAST
TECHNIQUE: Multidetector CT imaging of the chest was performed following the
standard protocol without IV contrast.

[Series 2: thorax · axial · 0.71mm/px · z∈[-322,-88]mm · 12 of 139 slices shown, 15 images]
[im 11/139  mediastinal]
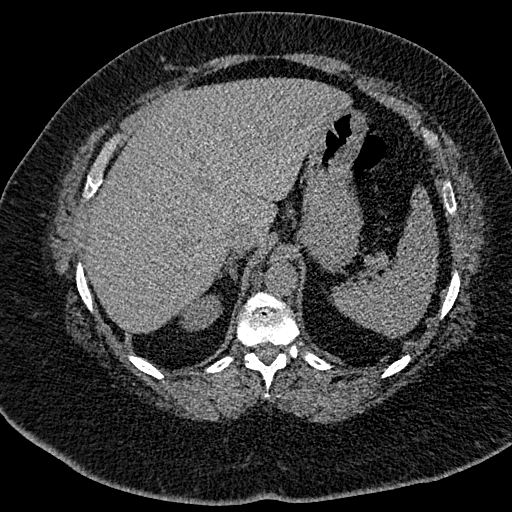
[im 11/139  lung]
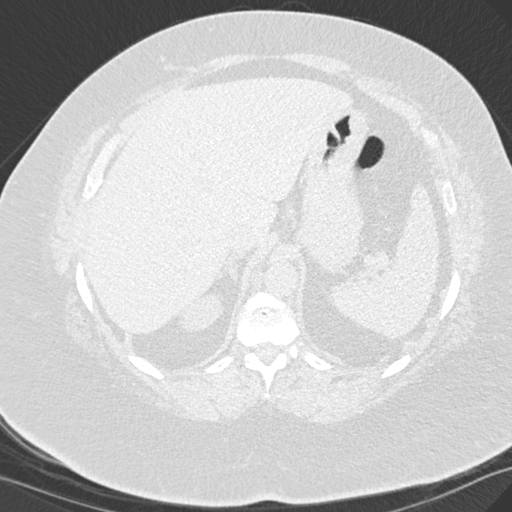
[im 21/139  lung]
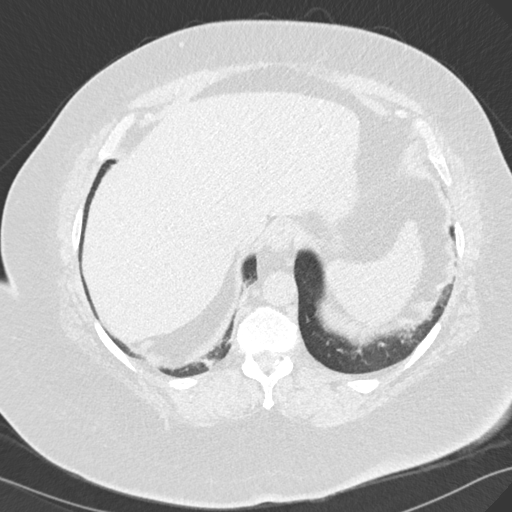
[im 31/139  lung]
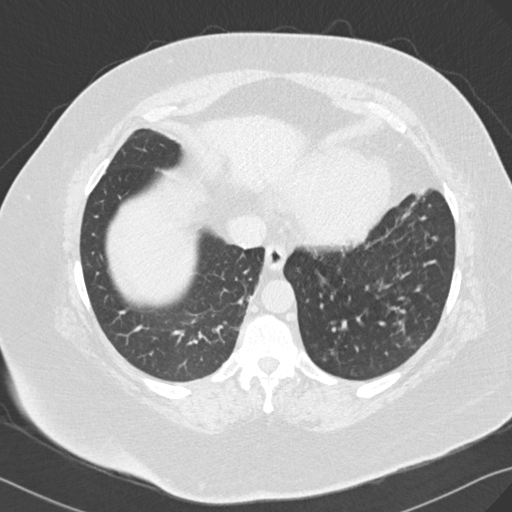
[im 41/139  lung]
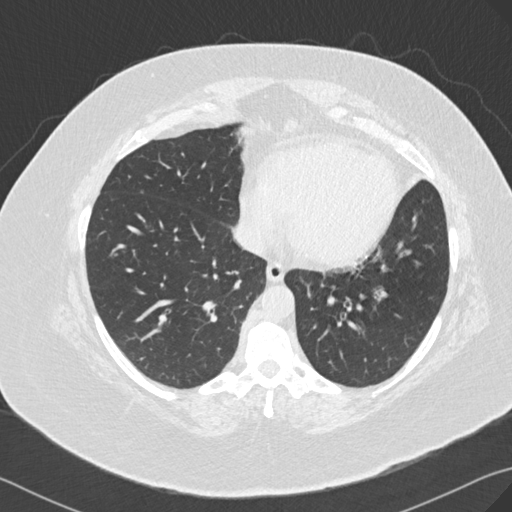
[im 52/139  mediastinal]
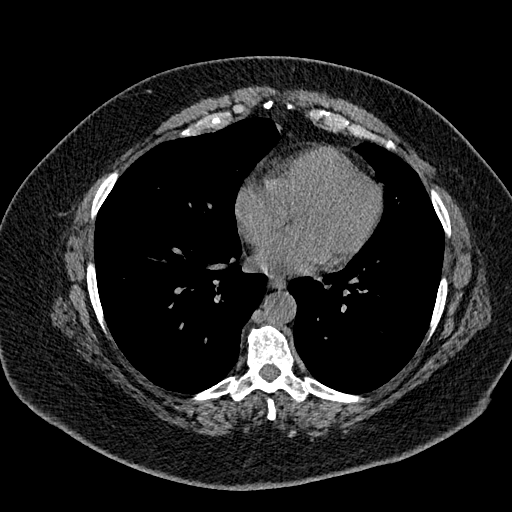
[im 52/139  lung]
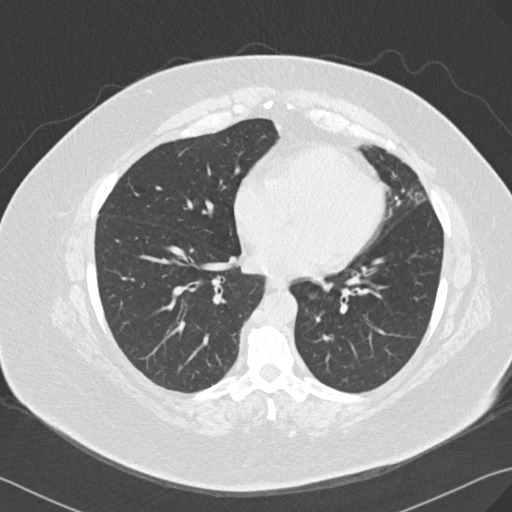
[im 62/139  lung]
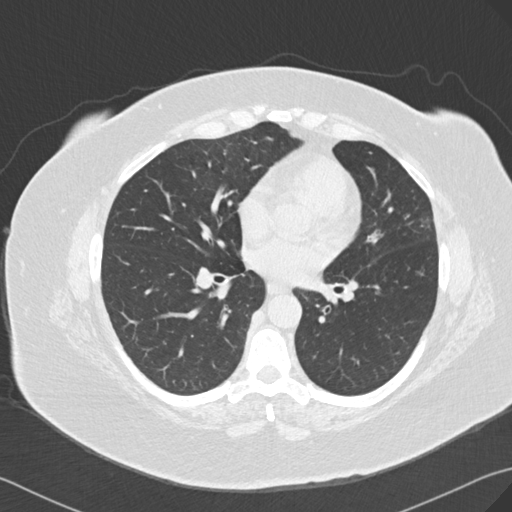
[im 77/139  lung]
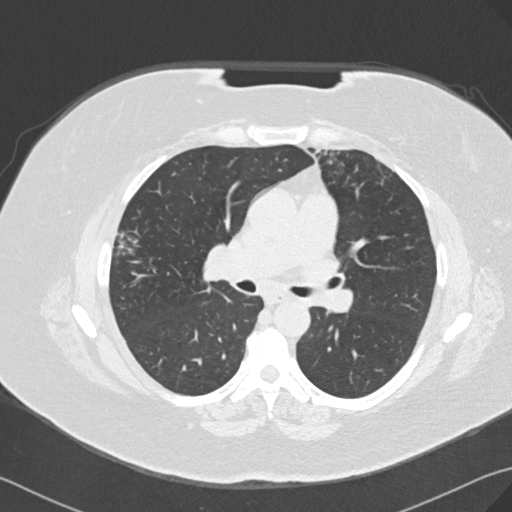
[im 87/139  lung]
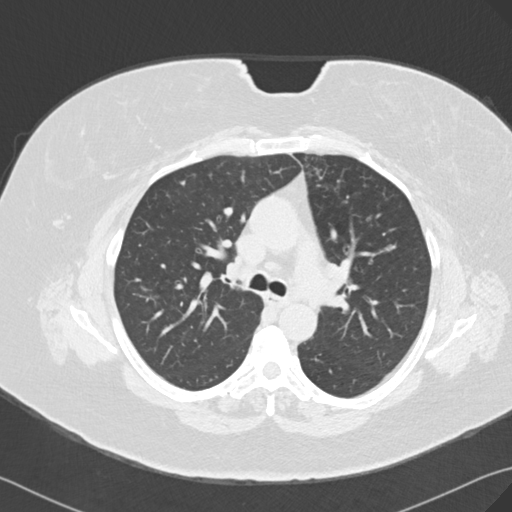
[im 98/139  mediastinal]
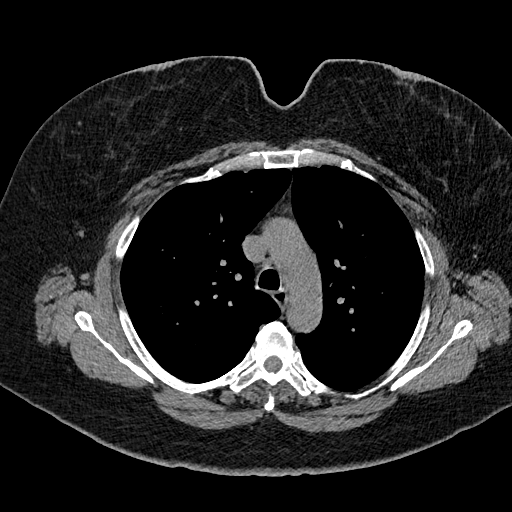
[im 98/139  lung]
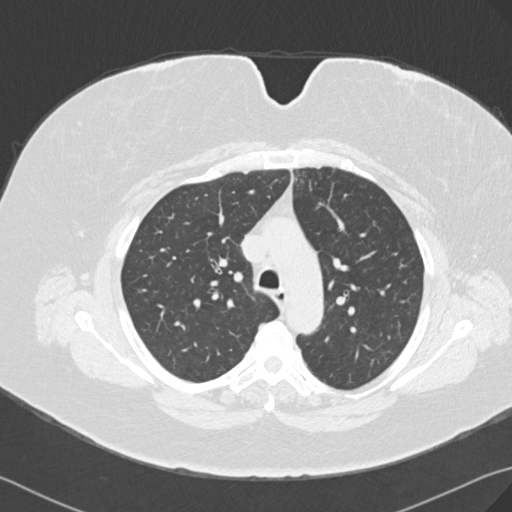
[im 108/139  lung]
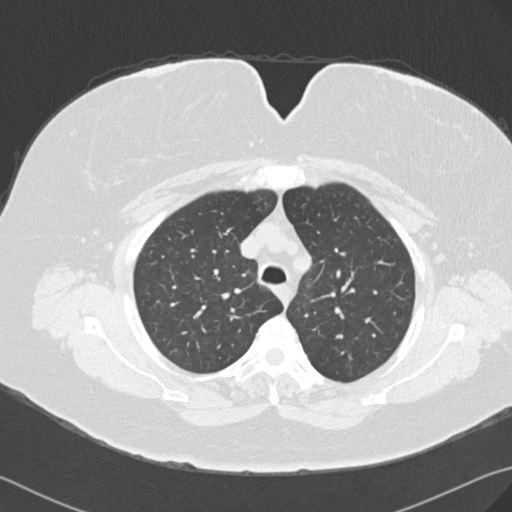
[im 118/139  lung]
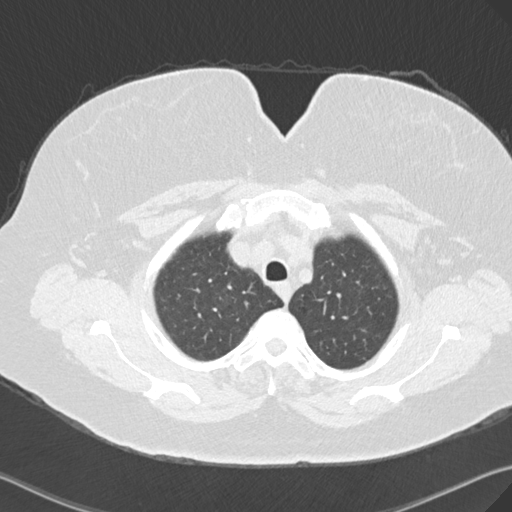
[im 128/139  lung]
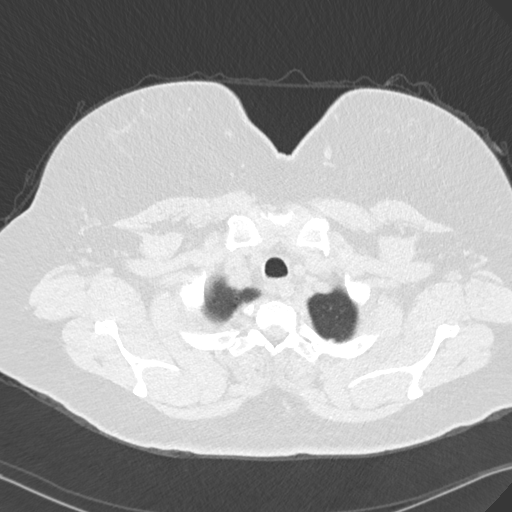

[Series 5: coronal · coronal · 0.57mm/px · 3 of 160 slices shown]
[im 32/160  lung]
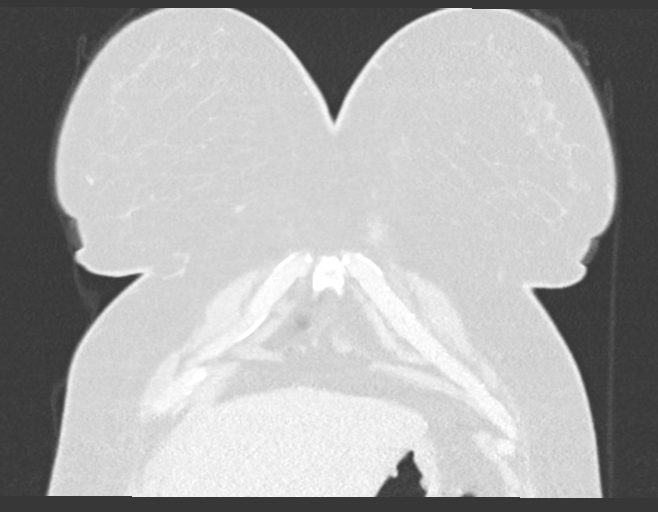
[im 64/160  lung]
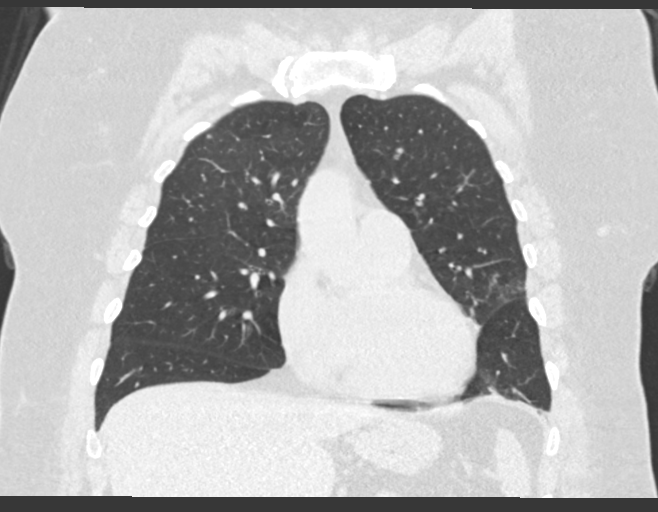
[im 96/160  lung]
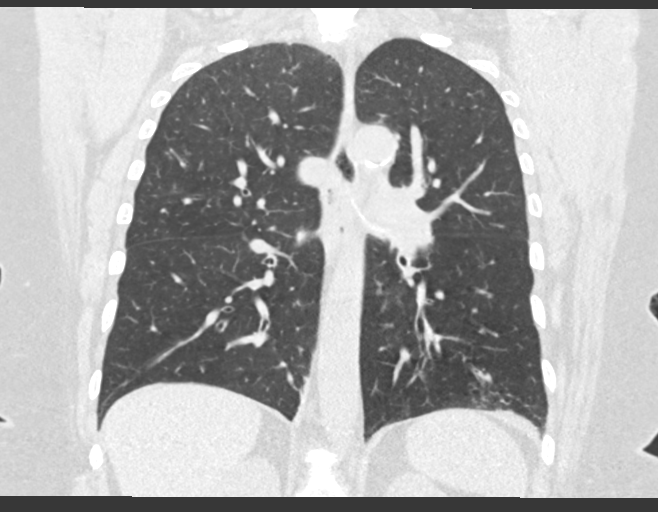

[15 of 36 positions shown; findings below may reference images not displayed]

FINDINGS: Cardiovascular: No acute findings. Aortic and coronary artery
atherosclerosis.

Mediastinum/Nodes: No masses or pathologically enlarged lymph nodes
identified on this unenhanced exam.

Lungs/Pleura: Tree-in-bud nodular densities are again seen
bilaterally which show no significant change. A few other scattered
sub-cm pulmonary nodules are also unchanged. No dominant or
enlarging pulmonary nodules or mass identified. No evidence of acute
infiltrate or pleural effusion.

Upper Abdomen:  Unremarkable.

Musculoskeletal:  No suspicious bone lesions.
IMPRESSION: Stable bilateral tree-in-bud nodular densities, consistent with
atypical infectious/inflammatory process, likely TIGER.

No new or progressive disease within the thorax.

Aortic Atherosclerosis ([id number]). Mild coronary artery
calcification.

## 2019-03-14 DIAGNOSIS — I499 Cardiac arrhythmia, unspecified: Secondary | ICD-10-CM | POA: Diagnosis not present

## 2019-03-14 DIAGNOSIS — J309 Allergic rhinitis, unspecified: Secondary | ICD-10-CM | POA: Diagnosis not present

## 2019-03-14 DIAGNOSIS — K5090 Crohn's disease, unspecified, without complications: Secondary | ICD-10-CM | POA: Diagnosis not present

## 2019-03-14 DIAGNOSIS — I10 Essential (primary) hypertension: Secondary | ICD-10-CM | POA: Diagnosis not present

## 2019-03-14 DIAGNOSIS — K219 Gastro-esophageal reflux disease without esophagitis: Secondary | ICD-10-CM | POA: Diagnosis not present

## 2019-03-14 DIAGNOSIS — J449 Chronic obstructive pulmonary disease, unspecified: Secondary | ICD-10-CM | POA: Diagnosis not present

## 2019-03-14 DIAGNOSIS — H547 Unspecified visual loss: Secondary | ICD-10-CM | POA: Diagnosis not present

## 2019-03-14 DIAGNOSIS — G8929 Other chronic pain: Secondary | ICD-10-CM | POA: Diagnosis not present

## 2019-03-14 DIAGNOSIS — I739 Peripheral vascular disease, unspecified: Secondary | ICD-10-CM | POA: Diagnosis not present

## 2019-03-14 DIAGNOSIS — I25119 Atherosclerotic heart disease of native coronary artery with unspecified angina pectoris: Secondary | ICD-10-CM | POA: Diagnosis not present

## 2019-03-18 DIAGNOSIS — J209 Acute bronchitis, unspecified: Secondary | ICD-10-CM | POA: Diagnosis not present

## 2019-04-18 DIAGNOSIS — J209 Acute bronchitis, unspecified: Secondary | ICD-10-CM | POA: Diagnosis not present

## 2019-04-26 DIAGNOSIS — L02512 Cutaneous abscess of left hand: Secondary | ICD-10-CM | POA: Diagnosis not present

## 2019-04-26 DIAGNOSIS — L03012 Cellulitis of left finger: Secondary | ICD-10-CM | POA: Diagnosis not present

## 2019-05-01 DIAGNOSIS — M1712 Unilateral primary osteoarthritis, left knee: Secondary | ICD-10-CM | POA: Diagnosis not present

## 2019-05-01 DIAGNOSIS — M2550 Pain in unspecified joint: Secondary | ICD-10-CM | POA: Diagnosis not present

## 2019-05-03 DIAGNOSIS — R69 Illness, unspecified: Secondary | ICD-10-CM | POA: Diagnosis not present

## 2019-05-05 DIAGNOSIS — M70842 Other soft tissue disorders related to use, overuse and pressure, left hand: Secondary | ICD-10-CM | POA: Diagnosis not present

## 2019-05-05 DIAGNOSIS — M7989 Other specified soft tissue disorders: Secondary | ICD-10-CM | POA: Diagnosis not present

## 2019-05-05 DIAGNOSIS — D481 Neoplasm of uncertain behavior of connective and other soft tissue: Secondary | ICD-10-CM | POA: Diagnosis not present

## 2019-05-08 DIAGNOSIS — K8010 Calculus of gallbladder with chronic cholecystitis without obstruction: Secondary | ICD-10-CM | POA: Diagnosis not present

## 2019-05-08 DIAGNOSIS — K810 Acute cholecystitis: Secondary | ICD-10-CM | POA: Diagnosis not present

## 2019-05-08 DIAGNOSIS — K219 Gastro-esophageal reflux disease without esophagitis: Secondary | ICD-10-CM | POA: Diagnosis not present

## 2019-05-08 DIAGNOSIS — R1013 Epigastric pain: Secondary | ICD-10-CM | POA: Diagnosis not present

## 2019-05-08 DIAGNOSIS — R062 Wheezing: Secondary | ICD-10-CM | POA: Diagnosis not present

## 2019-05-08 DIAGNOSIS — Z9981 Dependence on supplemental oxygen: Secondary | ICD-10-CM | POA: Diagnosis not present

## 2019-05-08 DIAGNOSIS — Z9049 Acquired absence of other specified parts of digestive tract: Secondary | ICD-10-CM | POA: Diagnosis not present

## 2019-05-08 DIAGNOSIS — R0902 Hypoxemia: Secondary | ICD-10-CM | POA: Diagnosis not present

## 2019-05-08 DIAGNOSIS — J441 Chronic obstructive pulmonary disease with (acute) exacerbation: Secondary | ICD-10-CM | POA: Diagnosis not present

## 2019-05-08 DIAGNOSIS — K8020 Calculus of gallbladder without cholecystitis without obstruction: Secondary | ICD-10-CM | POA: Diagnosis not present

## 2019-05-08 DIAGNOSIS — R072 Precordial pain: Secondary | ICD-10-CM | POA: Diagnosis not present

## 2019-05-08 DIAGNOSIS — Z8701 Personal history of pneumonia (recurrent): Secondary | ICD-10-CM | POA: Diagnosis not present

## 2019-05-08 DIAGNOSIS — I10 Essential (primary) hypertension: Secondary | ICD-10-CM | POA: Diagnosis not present

## 2019-05-08 DIAGNOSIS — R079 Chest pain, unspecified: Secondary | ICD-10-CM | POA: Diagnosis not present

## 2019-05-08 DIAGNOSIS — J449 Chronic obstructive pulmonary disease, unspecified: Secondary | ICD-10-CM | POA: Diagnosis not present

## 2019-05-08 DIAGNOSIS — D72829 Elevated white blood cell count, unspecified: Secondary | ICD-10-CM | POA: Diagnosis not present

## 2019-05-08 DIAGNOSIS — Z209 Contact with and (suspected) exposure to unspecified communicable disease: Secondary | ICD-10-CM | POA: Diagnosis not present

## 2019-05-08 DIAGNOSIS — K5909 Other constipation: Secondary | ICD-10-CM | POA: Diagnosis not present

## 2019-05-08 DIAGNOSIS — J9620 Acute and chronic respiratory failure, unspecified whether with hypoxia or hypercapnia: Secondary | ICD-10-CM | POA: Diagnosis not present

## 2019-05-08 DIAGNOSIS — K8000 Calculus of gallbladder with acute cholecystitis without obstruction: Secondary | ICD-10-CM | POA: Diagnosis not present

## 2019-05-08 DIAGNOSIS — R69 Illness, unspecified: Secondary | ICD-10-CM | POA: Diagnosis not present

## 2019-05-09 DIAGNOSIS — I10 Essential (primary) hypertension: Secondary | ICD-10-CM | POA: Diagnosis not present

## 2019-05-09 DIAGNOSIS — J449 Chronic obstructive pulmonary disease, unspecified: Secondary | ICD-10-CM | POA: Diagnosis not present

## 2019-05-09 DIAGNOSIS — R69 Illness, unspecified: Secondary | ICD-10-CM | POA: Diagnosis not present

## 2019-05-09 DIAGNOSIS — R1013 Epigastric pain: Secondary | ICD-10-CM | POA: Diagnosis not present

## 2019-05-09 DIAGNOSIS — J441 Chronic obstructive pulmonary disease with (acute) exacerbation: Secondary | ICD-10-CM | POA: Diagnosis not present

## 2019-05-09 DIAGNOSIS — Z9049 Acquired absence of other specified parts of digestive tract: Secondary | ICD-10-CM | POA: Diagnosis not present

## 2019-05-09 DIAGNOSIS — K8000 Calculus of gallbladder with acute cholecystitis without obstruction: Secondary | ICD-10-CM | POA: Diagnosis not present

## 2019-05-09 DIAGNOSIS — R079 Chest pain, unspecified: Secondary | ICD-10-CM | POA: Diagnosis not present

## 2019-05-09 DIAGNOSIS — K8010 Calculus of gallbladder with chronic cholecystitis without obstruction: Secondary | ICD-10-CM | POA: Diagnosis not present

## 2019-05-10 DIAGNOSIS — R079 Chest pain, unspecified: Secondary | ICD-10-CM | POA: Diagnosis not present

## 2019-05-10 DIAGNOSIS — R1013 Epigastric pain: Secondary | ICD-10-CM | POA: Diagnosis not present

## 2019-05-10 DIAGNOSIS — I10 Essential (primary) hypertension: Secondary | ICD-10-CM | POA: Diagnosis not present

## 2019-05-10 DIAGNOSIS — J441 Chronic obstructive pulmonary disease with (acute) exacerbation: Secondary | ICD-10-CM | POA: Diagnosis not present

## 2019-05-10 DIAGNOSIS — K810 Acute cholecystitis: Secondary | ICD-10-CM

## 2019-05-11 DIAGNOSIS — I10 Essential (primary) hypertension: Secondary | ICD-10-CM | POA: Diagnosis not present

## 2019-05-11 DIAGNOSIS — R1013 Epigastric pain: Secondary | ICD-10-CM | POA: Diagnosis not present

## 2019-05-11 DIAGNOSIS — R079 Chest pain, unspecified: Secondary | ICD-10-CM | POA: Diagnosis not present

## 2019-05-11 DIAGNOSIS — J441 Chronic obstructive pulmonary disease with (acute) exacerbation: Secondary | ICD-10-CM | POA: Diagnosis not present

## 2019-05-18 DIAGNOSIS — J209 Acute bronchitis, unspecified: Secondary | ICD-10-CM | POA: Diagnosis not present

## 2019-05-19 DIAGNOSIS — H524 Presbyopia: Secondary | ICD-10-CM | POA: Diagnosis not present

## 2019-05-24 DIAGNOSIS — K8000 Calculus of gallbladder with acute cholecystitis without obstruction: Secondary | ICD-10-CM | POA: Insufficient documentation

## 2019-05-24 DIAGNOSIS — Z09 Encounter for follow-up examination after completed treatment for conditions other than malignant neoplasm: Secondary | ICD-10-CM | POA: Insufficient documentation

## 2019-05-26 DIAGNOSIS — R69 Illness, unspecified: Secondary | ICD-10-CM | POA: Diagnosis not present

## 2019-05-30 DIAGNOSIS — Z79899 Other long term (current) drug therapy: Secondary | ICD-10-CM | POA: Diagnosis not present

## 2019-05-30 DIAGNOSIS — I10 Essential (primary) hypertension: Secondary | ICD-10-CM | POA: Diagnosis not present

## 2019-05-30 DIAGNOSIS — J449 Chronic obstructive pulmonary disease, unspecified: Secondary | ICD-10-CM | POA: Diagnosis not present

## 2019-05-30 DIAGNOSIS — Z6841 Body Mass Index (BMI) 40.0 and over, adult: Secondary | ICD-10-CM | POA: Diagnosis not present

## 2019-05-30 DIAGNOSIS — Z9049 Acquired absence of other specified parts of digestive tract: Secondary | ICD-10-CM | POA: Diagnosis not present

## 2019-05-30 DIAGNOSIS — E782 Mixed hyperlipidemia: Secondary | ICD-10-CM | POA: Diagnosis not present

## 2019-05-30 DIAGNOSIS — K219 Gastro-esophageal reflux disease without esophagitis: Secondary | ICD-10-CM | POA: Diagnosis not present

## 2019-05-30 DIAGNOSIS — R7303 Prediabetes: Secondary | ICD-10-CM | POA: Diagnosis not present

## 2019-06-18 DIAGNOSIS — J209 Acute bronchitis, unspecified: Secondary | ICD-10-CM | POA: Diagnosis not present

## 2019-06-19 DIAGNOSIS — M7989 Other specified soft tissue disorders: Secondary | ICD-10-CM | POA: Diagnosis not present

## 2019-06-25 DIAGNOSIS — R69 Illness, unspecified: Secondary | ICD-10-CM | POA: Diagnosis not present

## 2019-07-19 DIAGNOSIS — J209 Acute bronchitis, unspecified: Secondary | ICD-10-CM | POA: Diagnosis not present

## 2019-07-24 DIAGNOSIS — R69 Illness, unspecified: Secondary | ICD-10-CM | POA: Diagnosis not present

## 2019-07-27 DIAGNOSIS — R69 Illness, unspecified: Secondary | ICD-10-CM | POA: Diagnosis not present

## 2019-08-18 DIAGNOSIS — J209 Acute bronchitis, unspecified: Secondary | ICD-10-CM | POA: Diagnosis not present

## 2019-09-01 DIAGNOSIS — H25811 Combined forms of age-related cataract, right eye: Secondary | ICD-10-CM | POA: Diagnosis not present

## 2019-09-01 DIAGNOSIS — H25812 Combined forms of age-related cataract, left eye: Secondary | ICD-10-CM | POA: Diagnosis not present

## 2019-09-01 DIAGNOSIS — Z01818 Encounter for other preprocedural examination: Secondary | ICD-10-CM | POA: Diagnosis not present

## 2019-09-08 DIAGNOSIS — R05 Cough: Secondary | ICD-10-CM | POA: Diagnosis not present

## 2019-09-08 DIAGNOSIS — Z01818 Encounter for other preprocedural examination: Secondary | ICD-10-CM | POA: Diagnosis not present

## 2019-09-08 DIAGNOSIS — R69 Illness, unspecified: Secondary | ICD-10-CM | POA: Diagnosis not present

## 2019-09-08 DIAGNOSIS — J432 Centrilobular emphysema: Secondary | ICD-10-CM | POA: Diagnosis not present

## 2019-09-08 DIAGNOSIS — R0600 Dyspnea, unspecified: Secondary | ICD-10-CM | POA: Diagnosis not present

## 2019-09-18 DIAGNOSIS — Z0000 Encounter for general adult medical examination without abnormal findings: Secondary | ICD-10-CM | POA: Diagnosis not present

## 2019-09-18 DIAGNOSIS — Z9181 History of falling: Secondary | ICD-10-CM | POA: Diagnosis not present

## 2019-09-18 DIAGNOSIS — E785 Hyperlipidemia, unspecified: Secondary | ICD-10-CM | POA: Diagnosis not present

## 2019-09-18 DIAGNOSIS — Z6841 Body Mass Index (BMI) 40.0 and over, adult: Secondary | ICD-10-CM | POA: Diagnosis not present

## 2019-09-18 DIAGNOSIS — Z1331 Encounter for screening for depression: Secondary | ICD-10-CM | POA: Diagnosis not present

## 2019-09-18 DIAGNOSIS — J209 Acute bronchitis, unspecified: Secondary | ICD-10-CM | POA: Diagnosis not present

## 2019-09-19 DIAGNOSIS — H25812 Combined forms of age-related cataract, left eye: Secondary | ICD-10-CM | POA: Diagnosis not present

## 2019-09-19 DIAGNOSIS — R69 Illness, unspecified: Secondary | ICD-10-CM | POA: Diagnosis not present

## 2019-09-19 DIAGNOSIS — Z7952 Long term (current) use of systemic steroids: Secondary | ICD-10-CM | POA: Diagnosis not present

## 2019-09-19 DIAGNOSIS — M1990 Unspecified osteoarthritis, unspecified site: Secondary | ICD-10-CM | POA: Diagnosis not present

## 2019-09-19 DIAGNOSIS — Z79899 Other long term (current) drug therapy: Secondary | ICD-10-CM | POA: Diagnosis not present

## 2019-09-19 DIAGNOSIS — J449 Chronic obstructive pulmonary disease, unspecified: Secondary | ICD-10-CM | POA: Diagnosis not present

## 2019-09-19 DIAGNOSIS — I10 Essential (primary) hypertension: Secondary | ICD-10-CM | POA: Diagnosis not present

## 2019-09-19 DIAGNOSIS — K219 Gastro-esophageal reflux disease without esophagitis: Secondary | ICD-10-CM | POA: Diagnosis not present

## 2019-09-19 DIAGNOSIS — Z9981 Dependence on supplemental oxygen: Secondary | ICD-10-CM | POA: Diagnosis not present

## 2019-09-19 DIAGNOSIS — H259 Unspecified age-related cataract: Secondary | ICD-10-CM | POA: Diagnosis not present

## 2019-09-19 DIAGNOSIS — E785 Hyperlipidemia, unspecified: Secondary | ICD-10-CM | POA: Diagnosis not present

## 2019-10-08 DIAGNOSIS — R69 Illness, unspecified: Secondary | ICD-10-CM | POA: Diagnosis not present

## 2019-10-18 DIAGNOSIS — J209 Acute bronchitis, unspecified: Secondary | ICD-10-CM | POA: Diagnosis not present

## 2019-11-07 DIAGNOSIS — M1990 Unspecified osteoarthritis, unspecified site: Secondary | ICD-10-CM | POA: Diagnosis not present

## 2019-11-07 DIAGNOSIS — I10 Essential (primary) hypertension: Secondary | ICD-10-CM | POA: Diagnosis not present

## 2019-11-07 DIAGNOSIS — J449 Chronic obstructive pulmonary disease, unspecified: Secondary | ICD-10-CM | POA: Diagnosis not present

## 2019-11-07 DIAGNOSIS — H259 Unspecified age-related cataract: Secondary | ICD-10-CM | POA: Diagnosis not present

## 2019-11-07 DIAGNOSIS — H25811 Combined forms of age-related cataract, right eye: Secondary | ICD-10-CM | POA: Diagnosis not present

## 2019-11-07 DIAGNOSIS — Z9981 Dependence on supplemental oxygen: Secondary | ICD-10-CM | POA: Diagnosis not present

## 2019-11-07 DIAGNOSIS — E785 Hyperlipidemia, unspecified: Secondary | ICD-10-CM | POA: Diagnosis not present

## 2019-11-07 DIAGNOSIS — K219 Gastro-esophageal reflux disease without esophagitis: Secondary | ICD-10-CM | POA: Diagnosis not present

## 2019-11-07 DIAGNOSIS — R69 Illness, unspecified: Secondary | ICD-10-CM | POA: Diagnosis not present

## 2019-11-08 DIAGNOSIS — H25811 Combined forms of age-related cataract, right eye: Secondary | ICD-10-CM | POA: Diagnosis not present

## 2019-11-16 DIAGNOSIS — J309 Allergic rhinitis, unspecified: Secondary | ICD-10-CM | POA: Diagnosis not present

## 2019-11-16 DIAGNOSIS — E1151 Type 2 diabetes mellitus with diabetic peripheral angiopathy without gangrene: Secondary | ICD-10-CM | POA: Diagnosis not present

## 2019-11-16 DIAGNOSIS — K219 Gastro-esophageal reflux disease without esophagitis: Secondary | ICD-10-CM | POA: Diagnosis not present

## 2019-11-16 DIAGNOSIS — I951 Orthostatic hypotension: Secondary | ICD-10-CM | POA: Diagnosis not present

## 2019-11-16 DIAGNOSIS — Z008 Encounter for other general examination: Secondary | ICD-10-CM | POA: Diagnosis not present

## 2019-11-16 DIAGNOSIS — E785 Hyperlipidemia, unspecified: Secondary | ICD-10-CM | POA: Diagnosis not present

## 2019-11-16 DIAGNOSIS — I10 Essential (primary) hypertension: Secondary | ICD-10-CM | POA: Diagnosis not present

## 2019-11-16 DIAGNOSIS — I25119 Atherosclerotic heart disease of native coronary artery with unspecified angina pectoris: Secondary | ICD-10-CM | POA: Diagnosis not present

## 2019-11-16 DIAGNOSIS — J449 Chronic obstructive pulmonary disease, unspecified: Secondary | ICD-10-CM | POA: Diagnosis not present

## 2019-11-16 DIAGNOSIS — Z6841 Body Mass Index (BMI) 40.0 and over, adult: Secondary | ICD-10-CM | POA: Diagnosis not present

## 2019-11-18 DIAGNOSIS — J209 Acute bronchitis, unspecified: Secondary | ICD-10-CM | POA: Diagnosis not present

## 2019-12-19 DIAGNOSIS — J209 Acute bronchitis, unspecified: Secondary | ICD-10-CM | POA: Diagnosis not present

## 2020-01-16 DIAGNOSIS — J209 Acute bronchitis, unspecified: Secondary | ICD-10-CM | POA: Diagnosis not present

## 2020-01-19 DIAGNOSIS — Z9049 Acquired absence of other specified parts of digestive tract: Secondary | ICD-10-CM | POA: Diagnosis not present

## 2020-01-19 DIAGNOSIS — Z23 Encounter for immunization: Secondary | ICD-10-CM | POA: Diagnosis not present

## 2020-01-19 DIAGNOSIS — I700 Atherosclerosis of aorta: Secondary | ICD-10-CM | POA: Diagnosis not present

## 2020-01-19 DIAGNOSIS — Z79899 Other long term (current) drug therapy: Secondary | ICD-10-CM | POA: Diagnosis not present

## 2020-01-19 DIAGNOSIS — R7303 Prediabetes: Secondary | ICD-10-CM | POA: Diagnosis not present

## 2020-01-19 DIAGNOSIS — J449 Chronic obstructive pulmonary disease, unspecified: Secondary | ICD-10-CM | POA: Diagnosis not present

## 2020-01-19 DIAGNOSIS — J9611 Chronic respiratory failure with hypoxia: Secondary | ICD-10-CM | POA: Diagnosis not present

## 2020-01-19 DIAGNOSIS — I10 Essential (primary) hypertension: Secondary | ICD-10-CM | POA: Diagnosis not present

## 2020-01-19 DIAGNOSIS — K219 Gastro-esophageal reflux disease without esophagitis: Secondary | ICD-10-CM | POA: Diagnosis not present

## 2020-01-19 DIAGNOSIS — E782 Mixed hyperlipidemia: Secondary | ICD-10-CM | POA: Diagnosis not present

## 2020-02-16 DIAGNOSIS — J209 Acute bronchitis, unspecified: Secondary | ICD-10-CM | POA: Diagnosis not present

## 2020-03-17 DIAGNOSIS — J209 Acute bronchitis, unspecified: Secondary | ICD-10-CM | POA: Diagnosis not present

## 2020-03-21 DIAGNOSIS — R600 Localized edema: Secondary | ICD-10-CM | POA: Diagnosis not present

## 2020-03-21 DIAGNOSIS — Z6841 Body Mass Index (BMI) 40.0 and over, adult: Secondary | ICD-10-CM | POA: Diagnosis not present

## 2020-03-22 DIAGNOSIS — J432 Centrilobular emphysema: Secondary | ICD-10-CM | POA: Diagnosis not present

## 2020-03-22 DIAGNOSIS — Z01818 Encounter for other preprocedural examination: Secondary | ICD-10-CM | POA: Diagnosis not present

## 2020-03-28 DIAGNOSIS — Z6841 Body Mass Index (BMI) 40.0 and over, adult: Secondary | ICD-10-CM | POA: Diagnosis not present

## 2020-03-28 DIAGNOSIS — R600 Localized edema: Secondary | ICD-10-CM | POA: Diagnosis not present

## 2020-04-17 DIAGNOSIS — J209 Acute bronchitis, unspecified: Secondary | ICD-10-CM | POA: Diagnosis not present

## 2020-05-17 DIAGNOSIS — J209 Acute bronchitis, unspecified: Secondary | ICD-10-CM | POA: Diagnosis not present

## 2020-06-17 DIAGNOSIS — J209 Acute bronchitis, unspecified: Secondary | ICD-10-CM | POA: Diagnosis not present

## 2020-07-18 DIAGNOSIS — J209 Acute bronchitis, unspecified: Secondary | ICD-10-CM | POA: Diagnosis not present

## 2020-08-17 DIAGNOSIS — J209 Acute bronchitis, unspecified: Secondary | ICD-10-CM | POA: Diagnosis not present

## 2020-09-17 DIAGNOSIS — J209 Acute bronchitis, unspecified: Secondary | ICD-10-CM | POA: Diagnosis not present

## 2020-09-23 DIAGNOSIS — Z9181 History of falling: Secondary | ICD-10-CM | POA: Diagnosis not present

## 2020-09-23 DIAGNOSIS — E669 Obesity, unspecified: Secondary | ICD-10-CM | POA: Diagnosis not present

## 2020-09-23 DIAGNOSIS — Z0000 Encounter for general adult medical examination without abnormal findings: Secondary | ICD-10-CM | POA: Diagnosis not present

## 2020-09-23 DIAGNOSIS — Z1331 Encounter for screening for depression: Secondary | ICD-10-CM | POA: Diagnosis not present

## 2020-09-23 DIAGNOSIS — E785 Hyperlipidemia, unspecified: Secondary | ICD-10-CM | POA: Diagnosis not present

## 2020-09-26 DIAGNOSIS — Z9981 Dependence on supplemental oxygen: Secondary | ICD-10-CM | POA: Diagnosis not present

## 2020-09-26 DIAGNOSIS — J439 Emphysema, unspecified: Secondary | ICD-10-CM | POA: Diagnosis not present

## 2020-09-26 DIAGNOSIS — R059 Cough, unspecified: Secondary | ICD-10-CM | POA: Diagnosis not present

## 2020-09-26 DIAGNOSIS — R0600 Dyspnea, unspecified: Secondary | ICD-10-CM | POA: Diagnosis not present

## 2020-10-17 DIAGNOSIS — J209 Acute bronchitis, unspecified: Secondary | ICD-10-CM | POA: Diagnosis not present

## 2020-11-08 DIAGNOSIS — J9611 Chronic respiratory failure with hypoxia: Secondary | ICD-10-CM | POA: Diagnosis not present

## 2020-11-08 DIAGNOSIS — Z008 Encounter for other general examination: Secondary | ICD-10-CM | POA: Diagnosis not present

## 2020-11-08 DIAGNOSIS — J439 Emphysema, unspecified: Secondary | ICD-10-CM | POA: Diagnosis not present

## 2020-11-08 DIAGNOSIS — I700 Atherosclerosis of aorta: Secondary | ICD-10-CM | POA: Diagnosis not present

## 2020-11-08 DIAGNOSIS — Z6841 Body Mass Index (BMI) 40.0 and over, adult: Secondary | ICD-10-CM | POA: Diagnosis not present

## 2020-11-08 DIAGNOSIS — H04129 Dry eye syndrome of unspecified lacrimal gland: Secondary | ICD-10-CM | POA: Diagnosis not present

## 2020-11-08 DIAGNOSIS — I25119 Atherosclerotic heart disease of native coronary artery with unspecified angina pectoris: Secondary | ICD-10-CM | POA: Diagnosis not present

## 2020-11-08 DIAGNOSIS — G8929 Other chronic pain: Secondary | ICD-10-CM | POA: Diagnosis not present

## 2020-11-08 DIAGNOSIS — E1151 Type 2 diabetes mellitus with diabetic peripheral angiopathy without gangrene: Secondary | ICD-10-CM | POA: Diagnosis not present

## 2020-11-08 DIAGNOSIS — E785 Hyperlipidemia, unspecified: Secondary | ICD-10-CM | POA: Diagnosis not present

## 2020-11-17 DIAGNOSIS — J209 Acute bronchitis, unspecified: Secondary | ICD-10-CM | POA: Diagnosis not present

## 2020-12-18 DIAGNOSIS — J209 Acute bronchitis, unspecified: Secondary | ICD-10-CM | POA: Diagnosis not present

## 2021-01-15 DIAGNOSIS — J209 Acute bronchitis, unspecified: Secondary | ICD-10-CM | POA: Diagnosis not present

## 2021-02-15 DIAGNOSIS — J209 Acute bronchitis, unspecified: Secondary | ICD-10-CM | POA: Diagnosis not present

## 2021-03-17 DIAGNOSIS — J209 Acute bronchitis, unspecified: Secondary | ICD-10-CM | POA: Diagnosis not present

## 2021-04-17 DIAGNOSIS — J209 Acute bronchitis, unspecified: Secondary | ICD-10-CM | POA: Diagnosis not present

## 2021-05-07 DIAGNOSIS — J449 Chronic obstructive pulmonary disease, unspecified: Secondary | ICD-10-CM | POA: Diagnosis not present

## 2021-05-17 DIAGNOSIS — J209 Acute bronchitis, unspecified: Secondary | ICD-10-CM | POA: Diagnosis not present

## 2021-05-26 DIAGNOSIS — R7303 Prediabetes: Secondary | ICD-10-CM | POA: Diagnosis not present

## 2021-05-26 DIAGNOSIS — R600 Localized edema: Secondary | ICD-10-CM | POA: Diagnosis not present

## 2021-05-26 DIAGNOSIS — I10 Essential (primary) hypertension: Secondary | ICD-10-CM | POA: Diagnosis not present

## 2021-06-07 DIAGNOSIS — J449 Chronic obstructive pulmonary disease, unspecified: Secondary | ICD-10-CM | POA: Diagnosis not present

## 2021-06-17 DIAGNOSIS — J209 Acute bronchitis, unspecified: Secondary | ICD-10-CM | POA: Diagnosis not present

## 2021-07-08 DIAGNOSIS — J449 Chronic obstructive pulmonary disease, unspecified: Secondary | ICD-10-CM | POA: Diagnosis not present

## 2021-07-18 DIAGNOSIS — J209 Acute bronchitis, unspecified: Secondary | ICD-10-CM | POA: Diagnosis not present

## 2021-08-17 DIAGNOSIS — J209 Acute bronchitis, unspecified: Secondary | ICD-10-CM | POA: Diagnosis not present

## 2021-09-17 DIAGNOSIS — J209 Acute bronchitis, unspecified: Secondary | ICD-10-CM | POA: Diagnosis not present

## 2021-09-25 DIAGNOSIS — Z23 Encounter for immunization: Secondary | ICD-10-CM | POA: Diagnosis not present

## 2021-09-25 DIAGNOSIS — Z0000 Encounter for general adult medical examination without abnormal findings: Secondary | ICD-10-CM | POA: Diagnosis not present

## 2021-09-25 DIAGNOSIS — Z9181 History of falling: Secondary | ICD-10-CM | POA: Diagnosis not present

## 2021-09-25 DIAGNOSIS — E785 Hyperlipidemia, unspecified: Secondary | ICD-10-CM | POA: Diagnosis not present

## 2021-10-17 DIAGNOSIS — J209 Acute bronchitis, unspecified: Secondary | ICD-10-CM | POA: Diagnosis not present

## 2021-10-22 DIAGNOSIS — R229 Localized swelling, mass and lump, unspecified: Secondary | ICD-10-CM | POA: Diagnosis not present

## 2021-10-23 DIAGNOSIS — R222 Localized swelling, mass and lump, trunk: Secondary | ICD-10-CM | POA: Diagnosis not present

## 2021-10-24 DIAGNOSIS — K5900 Constipation, unspecified: Secondary | ICD-10-CM | POA: Diagnosis not present

## 2021-10-24 DIAGNOSIS — R600 Localized edema: Secondary | ICD-10-CM | POA: Diagnosis not present

## 2021-10-24 DIAGNOSIS — R079 Chest pain, unspecified: Secondary | ICD-10-CM | POA: Diagnosis not present

## 2021-10-24 DIAGNOSIS — I10 Essential (primary) hypertension: Secondary | ICD-10-CM | POA: Diagnosis not present

## 2021-10-24 DIAGNOSIS — I700 Atherosclerosis of aorta: Secondary | ICD-10-CM | POA: Diagnosis not present

## 2021-10-24 DIAGNOSIS — B351 Tinea unguium: Secondary | ICD-10-CM | POA: Diagnosis not present

## 2021-10-24 DIAGNOSIS — K219 Gastro-esophageal reflux disease without esophagitis: Secondary | ICD-10-CM | POA: Diagnosis not present

## 2021-10-24 DIAGNOSIS — J449 Chronic obstructive pulmonary disease, unspecified: Secondary | ICD-10-CM | POA: Diagnosis not present

## 2021-11-04 DIAGNOSIS — Z9981 Dependence on supplemental oxygen: Secondary | ICD-10-CM | POA: Diagnosis not present

## 2021-11-04 DIAGNOSIS — Z01818 Encounter for other preprocedural examination: Secondary | ICD-10-CM | POA: Diagnosis not present

## 2021-11-04 DIAGNOSIS — R0609 Other forms of dyspnea: Secondary | ICD-10-CM | POA: Diagnosis not present

## 2021-11-04 DIAGNOSIS — J432 Centrilobular emphysema: Secondary | ICD-10-CM | POA: Diagnosis not present

## 2021-11-05 ENCOUNTER — Other Ambulatory Visit: Payer: Self-pay | Admitting: Specialist

## 2021-11-05 DIAGNOSIS — J432 Centrilobular emphysema: Secondary | ICD-10-CM

## 2021-11-05 DIAGNOSIS — R0609 Other forms of dyspnea: Secondary | ICD-10-CM

## 2021-11-10 DIAGNOSIS — R222 Localized swelling, mass and lump, trunk: Secondary | ICD-10-CM | POA: Diagnosis not present

## 2021-11-10 DIAGNOSIS — J449 Chronic obstructive pulmonary disease, unspecified: Secondary | ICD-10-CM | POA: Diagnosis not present

## 2021-11-10 DIAGNOSIS — D216 Benign neoplasm of connective and other soft tissue of trunk, unspecified: Secondary | ICD-10-CM | POA: Diagnosis not present

## 2021-11-10 DIAGNOSIS — D171 Benign lipomatous neoplasm of skin and subcutaneous tissue of trunk: Secondary | ICD-10-CM | POA: Diagnosis not present

## 2021-11-10 DIAGNOSIS — I10 Essential (primary) hypertension: Secondary | ICD-10-CM | POA: Diagnosis not present

## 2021-11-17 ENCOUNTER — Encounter: Payer: Self-pay | Admitting: Podiatry

## 2021-11-17 ENCOUNTER — Ambulatory Visit (INDEPENDENT_AMBULATORY_CARE_PROVIDER_SITE_OTHER): Payer: Commercial Managed Care - HMO | Admitting: Podiatry

## 2021-11-17 ENCOUNTER — Other Ambulatory Visit: Payer: Self-pay

## 2021-11-17 DIAGNOSIS — R112 Nausea with vomiting, unspecified: Secondary | ICD-10-CM | POA: Insufficient documentation

## 2021-11-17 DIAGNOSIS — B351 Tinea unguium: Secondary | ICD-10-CM

## 2021-11-17 DIAGNOSIS — Z1389 Encounter for screening for other disorder: Secondary | ICD-10-CM | POA: Insufficient documentation

## 2021-11-17 DIAGNOSIS — Z23 Encounter for immunization: Secondary | ICD-10-CM | POA: Insufficient documentation

## 2021-11-17 DIAGNOSIS — Z720 Tobacco use: Secondary | ICD-10-CM | POA: Insufficient documentation

## 2021-11-17 DIAGNOSIS — R739 Hyperglycemia, unspecified: Secondary | ICD-10-CM | POA: Insufficient documentation

## 2021-11-17 DIAGNOSIS — K219 Gastro-esophageal reflux disease without esophagitis: Secondary | ICD-10-CM | POA: Insufficient documentation

## 2021-11-17 DIAGNOSIS — M79609 Pain in unspecified limb: Secondary | ICD-10-CM

## 2021-11-17 DIAGNOSIS — R059 Cough, unspecified: Secondary | ICD-10-CM | POA: Insufficient documentation

## 2021-11-17 DIAGNOSIS — L600 Ingrowing nail: Secondary | ICD-10-CM | POA: Diagnosis not present

## 2021-11-17 DIAGNOSIS — E782 Mixed hyperlipidemia: Secondary | ICD-10-CM | POA: Insufficient documentation

## 2021-11-17 DIAGNOSIS — M25569 Pain in unspecified knee: Secondary | ICD-10-CM | POA: Insufficient documentation

## 2021-11-17 DIAGNOSIS — I739 Peripheral vascular disease, unspecified: Secondary | ICD-10-CM

## 2021-11-17 DIAGNOSIS — J309 Allergic rhinitis, unspecified: Secondary | ICD-10-CM | POA: Insufficient documentation

## 2021-11-17 DIAGNOSIS — K5900 Constipation, unspecified: Secondary | ICD-10-CM | POA: Insufficient documentation

## 2021-11-17 DIAGNOSIS — M8580 Other specified disorders of bone density and structure, unspecified site: Secondary | ICD-10-CM | POA: Insufficient documentation

## 2021-11-17 DIAGNOSIS — Z79899 Other long term (current) drug therapy: Secondary | ICD-10-CM | POA: Insufficient documentation

## 2021-11-17 DIAGNOSIS — M1710 Unilateral primary osteoarthritis, unspecified knee: Secondary | ICD-10-CM | POA: Insufficient documentation

## 2021-11-17 DIAGNOSIS — J209 Acute bronchitis, unspecified: Secondary | ICD-10-CM | POA: Diagnosis not present

## 2021-11-17 DIAGNOSIS — Z124 Encounter for screening for malignant neoplasm of cervix: Secondary | ICD-10-CM | POA: Insufficient documentation

## 2021-11-17 DIAGNOSIS — IMO0002 Reserved for concepts with insufficient information to code with codable children: Secondary | ICD-10-CM | POA: Insufficient documentation

## 2021-11-17 DIAGNOSIS — I10 Essential (primary) hypertension: Secondary | ICD-10-CM | POA: Insufficient documentation

## 2021-11-17 DIAGNOSIS — R32 Unspecified urinary incontinence: Secondary | ICD-10-CM | POA: Insufficient documentation

## 2021-11-17 NOTE — Progress Notes (Signed)
°  Subjective:  Patient ID: JESUSITA JOCELYN, female    DOB: [birth year],  MRN: [id number]  Chief Complaint  Patient presents with   Nail Problem    Toenails are thick and discolored and they do curl in    66 y.o. female presents with the above complaint. History confirmed with patient.   Hx of DM but now diet controlled. Does not check sugars daily nor aware of last A1c. Reports cramping in the feet L>R.   Crutchfield, Ashely B, FNP. Last seen 3 weeks ago.   Objective:  Physical Exam: warm, good capillary refill, normal sensory exam, and palpable DP pulses, non-palpable PT pulses. Excessive distal cooling noted. Pitting edema BLE. Nails thickened, dystrophic; ingrowing bilateral hallux with POP.   Assessment:   1. PAD (peripheral artery disease) ([place])   2. Ingrown nail   3. Pain due to onychomycosis of nail    Plan:  Patient was evaluated and treated and all questions answered.  Onychomycosis with PAD -Nails palliatively debrided secondary to pain  Procedure: Nail Debridement Type of Debridement: manual, sharp debridement. Instrumentation: Nail nipper, rotary burr. Number of Nails: 10  PAD with ?Hx of DM -Will order non-invasive vascular studies  Return in about 3 months (around 02/15/2022) for Diabetic Foot Care.

## 2021-11-24 ENCOUNTER — Ambulatory Visit: Admission: RE | Admit: 2021-11-24 | Payer: Medicare Other | Source: Ambulatory Visit

## 2021-12-01 ENCOUNTER — Ambulatory Visit (INDEPENDENT_AMBULATORY_CARE_PROVIDER_SITE_OTHER): Payer: Medicare Other

## 2021-12-01 ENCOUNTER — Other Ambulatory Visit: Payer: Self-pay

## 2021-12-01 DIAGNOSIS — I739 Peripheral vascular disease, unspecified: Secondary | ICD-10-CM | POA: Diagnosis not present

## 2021-12-11 ENCOUNTER — Telehealth: Payer: Self-pay | Admitting: Podiatry

## 2021-12-11 ENCOUNTER — Telehealth: Payer: Commercial Managed Care - HMO | Admitting: Podiatry

## 2021-12-11 NOTE — Telephone Encounter (Signed)
Thanks

## 2021-12-11 NOTE — Telephone Encounter (Signed)
Pt is scheduled for 800am virtual appt on 2.20.23 to go over vascular results.

## 2021-12-15 ENCOUNTER — Encounter: Payer: Self-pay | Admitting: Podiatry

## 2021-12-15 ENCOUNTER — Ambulatory Visit (INDEPENDENT_AMBULATORY_CARE_PROVIDER_SITE_OTHER): Payer: Medicare Other | Admitting: Podiatry

## 2021-12-15 DIAGNOSIS — I739 Peripheral vascular disease, unspecified: Secondary | ICD-10-CM | POA: Diagnosis not present

## 2021-12-15 NOTE — Progress Notes (Signed)
°  Subjective:  Patient ID: Angela Pitts, female    DOB: 1956/02/22,   MRN: [id number]  No chief complaint on file.   66 y.o. female presents over phone for virtual visit to discuss results of ABIs obtainined after last Visit. Patient relates she has continued to have cramps and pain in her legs especially after walking certain periods of time. Denies any other pedal complaints. Denies n/v/f/c.   Past Medical History:  Diagnosis Date   Allergic rhinitis    Benign essential hypertension    COPD (chronic obstructive pulmonary disease) (HCC)    GERD (gastroesophageal reflux disease)    Mixed hyperlipidemia    Osteopenia    Prediabetes     Objective:  No examination preformed due to virtual visit.  Assessment:   1. PAD (peripheral artery disease) ([place])      Plan:  Patient presents for virutal visit to discuss vascular studies.  Discussed results of ABIs and recommend follow-up with Vascular surgeon.  Referral placed for vascular surgeon.  Patient to return in about 2 months for routine foot care.    Lorenda Peck, DPM

## 2021-12-18 DIAGNOSIS — J209 Acute bronchitis, unspecified: Secondary | ICD-10-CM | POA: Diagnosis not present

## 2022-01-15 DIAGNOSIS — J209 Acute bronchitis, unspecified: Secondary | ICD-10-CM | POA: Diagnosis not present

## 2022-01-22 DIAGNOSIS — K5900 Constipation, unspecified: Secondary | ICD-10-CM | POA: Diagnosis not present

## 2022-01-22 DIAGNOSIS — I700 Atherosclerosis of aorta: Secondary | ICD-10-CM | POA: Diagnosis not present

## 2022-01-22 DIAGNOSIS — E782 Mixed hyperlipidemia: Secondary | ICD-10-CM | POA: Diagnosis not present

## 2022-01-22 DIAGNOSIS — H9390 Unspecified disorder of ear, unspecified ear: Secondary | ICD-10-CM | POA: Diagnosis not present

## 2022-01-22 DIAGNOSIS — J449 Chronic obstructive pulmonary disease, unspecified: Secondary | ICD-10-CM | POA: Diagnosis not present

## 2022-01-22 DIAGNOSIS — R079 Chest pain, unspecified: Secondary | ICD-10-CM | POA: Diagnosis not present

## 2022-01-22 DIAGNOSIS — R600 Localized edema: Secondary | ICD-10-CM | POA: Diagnosis not present

## 2022-01-22 DIAGNOSIS — I10 Essential (primary) hypertension: Secondary | ICD-10-CM | POA: Diagnosis not present

## 2022-01-22 DIAGNOSIS — K219 Gastro-esophageal reflux disease without esophagitis: Secondary | ICD-10-CM | POA: Diagnosis not present

## 2022-01-27 ENCOUNTER — Other Ambulatory Visit (INDEPENDENT_AMBULATORY_CARE_PROVIDER_SITE_OTHER): Payer: Self-pay | Admitting: Nurse Practitioner

## 2022-01-27 DIAGNOSIS — I739 Peripheral vascular disease, unspecified: Secondary | ICD-10-CM

## 2022-01-27 DIAGNOSIS — R6889 Other general symptoms and signs: Secondary | ICD-10-CM

## 2022-01-28 ENCOUNTER — Encounter (INDEPENDENT_AMBULATORY_CARE_PROVIDER_SITE_OTHER): Payer: Self-pay | Admitting: Nurse Practitioner

## 2022-01-28 ENCOUNTER — Ambulatory Visit (INDEPENDENT_AMBULATORY_CARE_PROVIDER_SITE_OTHER): Payer: Medicare Other

## 2022-01-28 ENCOUNTER — Encounter (INDEPENDENT_AMBULATORY_CARE_PROVIDER_SITE_OTHER): Payer: Self-pay

## 2022-01-28 ENCOUNTER — Ambulatory Visit (INDEPENDENT_AMBULATORY_CARE_PROVIDER_SITE_OTHER): Payer: Medicare Other | Admitting: Nurse Practitioner

## 2022-01-28 VITALS — BP 126/81 | HR 101 | Resp 18 | Wt 291.0 lb

## 2022-01-28 DIAGNOSIS — Z720 Tobacco use: Secondary | ICD-10-CM

## 2022-01-28 DIAGNOSIS — I10 Essential (primary) hypertension: Secondary | ICD-10-CM

## 2022-01-28 DIAGNOSIS — I739 Peripheral vascular disease, unspecified: Secondary | ICD-10-CM

## 2022-01-28 DIAGNOSIS — R6889 Other general symptoms and signs: Secondary | ICD-10-CM

## 2022-01-30 DIAGNOSIS — H9203 Otalgia, bilateral: Secondary | ICD-10-CM | POA: Diagnosis not present

## 2022-01-30 DIAGNOSIS — H6693 Otitis media, unspecified, bilateral: Secondary | ICD-10-CM | POA: Diagnosis not present

## 2022-01-30 DIAGNOSIS — H6091 Unspecified otitis externa, right ear: Secondary | ICD-10-CM | POA: Diagnosis not present

## 2022-02-01 DIAGNOSIS — H01005 Unspecified blepharitis left lower eyelid: Secondary | ICD-10-CM | POA: Diagnosis not present

## 2022-02-01 DIAGNOSIS — J069 Acute upper respiratory infection, unspecified: Secondary | ICD-10-CM | POA: Diagnosis not present

## 2022-02-01 DIAGNOSIS — H60501 Unspecified acute noninfective otitis externa, right ear: Secondary | ICD-10-CM | POA: Diagnosis not present

## 2022-02-08 ENCOUNTER — Encounter (INDEPENDENT_AMBULATORY_CARE_PROVIDER_SITE_OTHER): Payer: Self-pay | Admitting: Nurse Practitioner

## 2022-02-08 NOTE — Progress Notes (Signed)
? ?Subjective:  ? ? Patient ID: Angela Pitts, female    DOB: 10-Oct-1956, 66 y.o.   MRN: [id number] ?Chief Complaint  ?Patient presents with  ? New Patient (Initial Visit)  ?  Ref consult abnormal ABIs  ? ? ?Angela Pitts is a 66 year old female that presents today due to lower extremity pain and abnormal ABIs.  The patient notes that initially the pain was from the knees down about 3 months ago.  She notes that she was active at that time.  She began to have lower back issues and subsequently began to have burning in her left thigh.  The burning sensation comes and goes and rubbing and shaking the area helps.  She notes that when she gets numbness in her legs her back hurts as well.. ? ?Initially the patient had ABIs done which showed an ABI 0.89 on the right and 0.75 on the left.  She had TBI 0.62 bilaterally.  There are triphasic waveforms from the right common femoral artery down to the tibial vessels with normal toe waveforms.  The left did not have the common femoral artery visualized but there were biphasic waveforms down to the left tibial arteries with normal toe waveforms. ? ?Today noninvasive studies show an ABI of 1.13 on the right and 1.20 on the left.  There is a TBI 0.94 on the right and 0.88 on the left.  She has triphasic tibial artery waveforms with normal toe waveforms bilaterally. ? ? ?Review of Systems  ?Cardiovascular:  Positive for leg swelling.  ?Musculoskeletal:  Positive for arthralgias, back pain and gait problem.  ?Neurological:  Positive for numbness.  ?All other systems reviewed and are negative. ? ?   ?Objective:  ? Physical Exam ?Vitals reviewed.  ?Constitutional:   ?   Appearance: She is obese.  ?HENT:  ?   Head: Normocephalic.  ?Cardiovascular:  ?   Rate and Rhythm: Normal rate.  ?Pulmonary:  ?   Effort: Pulmonary effort is normal.  ?Musculoskeletal:  ?   Right lower leg: Edema present.  ?   Left lower leg: Edema present.  ?Skin: ?   General: Skin is warm and dry.   ?Neurological:  ?   Mental Status: She is alert and oriented to person, place, and time.  ?   Gait: Gait abnormal.  ?Psychiatric:     ?   Mood and Affect: Mood normal.     ?   Behavior: Behavior normal.     ?   Thought Content: Thought content normal.     ?   Judgment: Judgment normal.  ? ? ?BP 126/81 (BP Location: Right Arm)   Pulse (!) 101   Resp 18   Wt 291 lb (132 kg)  ? ?Past Medical History:  ?Diagnosis Date  ? Allergic rhinitis   ? Benign essential hypertension   ? COPD (chronic obstructive pulmonary disease) ([place])   ? GERD (gastroesophageal reflux disease)   ? Mixed hyperlipidemia   ? Osteopenia   ? Prediabetes   ? ? ?Social History  ? ?Socioeconomic History  ? Marital status: Widowed  ?  Spouse name: Not on file  ? Number of children: Not on file  ? Years of education: Not on file  ? Highest education level: Not on file  ?Occupational History  ? Not on file  ?Tobacco Use  ? Smoking status: Former  ?  Types: Cigarettes  ? Smokeless tobacco: Never  ?Substance and Sexual Activity  ? Alcohol use: Never  ?  Drug use: Never  ? Sexual activity: Not on file  ?Other Topics Concern  ? Not on file  ?Social History Narrative  ? Not on file  ? ?Social Determinants of Health  ? ?Financial Resource Strain: Not on file  ?Food Insecurity: Not on file  ?Transportation Needs: Not on file  ?Physical Activity: Not on file  ?Stress: Not on file  ?Social Connections: Not on file  ?Intimate Partner Violence: Not on file  ? ? ?Past Surgical History:  ?Procedure Laterality Date  ? BLADDER SUSPENSION    ? CHOLECYSTECTOMY    ? EYE SURGERY    ? left hand reconstruction    ? LIPOMA EXCISION Right   ? back area  ? ? ?Family History  ?Problem Relation Age of Onset  ? CAD Mother   ? Heart attack Mother   ? Hypertension Sister   ? COPD Sister   ? Alpha-1 antitrypsin deficiency Sister   ? ? ?No Known Allergies ? ?   ? View : No data to display.  ?  ?  ?  ? ? ? ? ?CMP  ?No results found for: NA, K, CL, CO2, GLUCOSE, BUN, CREATININE,  CALCIUM, PROT, ALBUMIN, AST, ALT, ALKPHOS, BILITOT, GFRNONAA, GFRAA ? ? ?VAS [place] ABI WITH/WO TBI ? ?Result Date: 02/06/2022 ? LOWER EXTREMITY DOPPLER STUDY Patient Name:  Angela Pitts  Date of Exam:   01/28/2022 Medical Rec #: [id number]           Accession #:    [id number] Date of Birth: January 22, 1956           Patient Gender: F Patient Age:   45 years Exam Location:  [facility] Procedure:      VAS [place] ABI WITH/WO TBI Referring Phys: Hortencia Pilar --------------------------------------------------------------------------------  Indications: Abnormal ABIs.  Comparison Study: 12/01/2021 Performing Technologist: Almira Coaster RVS  Examination Guidelines: A complete evaluation includes at minimum, Doppler waveform signals and systolic blood pressure reading at the level of bilateral brachial, anterior tibial, and posterior tibial arteries, when vessel segments are accessible. Bilateral testing is considered an integral part of a complete examination. Photoelectric Plethysmograph (PPG) waveforms and toe systolic pressure readings are included as required and additional duplex testing as needed. Limited examinations for reoccurring indications may be performed as noted.  ABI Findings: +---------+------------------+-----+---------+--------+ Right    Rt Pressure (mmHg)IndexWaveform Comment  +---------+------------------+-----+---------+--------+ Brachial 145                                      +---------+------------------+-----+---------+--------+ ATA      164               1.13 triphasic         +---------+------------------+-----+---------+--------+ PTA      164               1.13 triphasic         +---------+------------------+-----+---------+--------+ [place] Campbell               0.94 Normal            +---------+------------------+-----+---------+--------+ +---------+------------------+-----+---------+-------+ Left     Lt Pressure (mmHg)IndexWaveform Comment  +---------+------------------+-----+---------+-------+ Brachial 124                                     +---------+------------------+-----+---------+-------+ ATA      171  1.18 triphasic        +---------+------------------+-----+---------+-------+ PTA      174               1.20 triphasic        +---------+------------------+-----+---------+-------+ Great Toe128               0.88 Normal           +---------+------------------+-----+---------+-------+ +-------+-----------+-----------+------------+------------+ ABI/TBIToday's ABIToday's TBIPrevious ABIPrevious TBI +-------+-----------+-----------+------------+------------+ Right  1.13       .94        .89         .62          +-------+-----------+-----------+------------+------------+ Left   1.20       .88        .75         .62          +-------+-----------+-----------+------------+------------+  Summary: Right: Resting right ankle-brachial index is within normal range. No evidence of significant right lower extremity arterial disease. The right toe-brachial index is normal. Left: Resting left ankle-brachial index is within normal range. No evidence of significant left lower extremity arterial disease. The left toe-brachial index is normal.  *See table(s) above for measurements and observations.  Electronically signed by Hortencia Pilar MD on 02/06/2022 at 1:13:44 PM.    Final   ? ? ?   ?Assessment & Plan:  ? ?1. [facility]) ?The patient has ABIs that are very different just a short time apart.  The initial ABIs show that they are diminished as a possible cause of her pain discomfort however the second set of ABIs are normal.  I discussed with the patient the options of undergoing a lower extremity angiogram however at this time the patient did not wish to undergo more invasive studies.  Therefore we discussed a CT angiogram to evaluate evidence of significant stenosis in the patient's larger vessels. ?-  CT ANGIO AO+BIFEM W & OR WO CONTRAST; Future ? ?2. Benign essential HTN ?Continue antihypertensive medications as already ordered, these medications have been reviewed and there are no changes at this time.  ? ?3. Current tobacco use ?Smoking cessation was discussed, 3-10 minutes spent on this topic specif

## 2022-02-15 DIAGNOSIS — J209 Acute bronchitis, unspecified: Secondary | ICD-10-CM | POA: Diagnosis not present

## 2022-02-16 ENCOUNTER — Ambulatory Visit: Payer: Commercial Managed Care - HMO | Admitting: Podiatry

## 2022-02-16 DIAGNOSIS — N390 Urinary tract infection, site not specified: Secondary | ICD-10-CM | POA: Diagnosis not present

## 2022-02-16 DIAGNOSIS — M25531 Pain in right wrist: Secondary | ICD-10-CM | POA: Diagnosis not present

## 2022-02-19 ENCOUNTER — Ambulatory Visit: Payer: Medicare Other | Admitting: Podiatry

## 2022-02-24 DIAGNOSIS — M25531 Pain in right wrist: Secondary | ICD-10-CM | POA: Diagnosis not present

## 2022-02-24 DIAGNOSIS — E782 Mixed hyperlipidemia: Secondary | ICD-10-CM | POA: Diagnosis not present

## 2022-02-26 ENCOUNTER — Encounter: Payer: Self-pay | Admitting: Podiatry

## 2022-02-26 ENCOUNTER — Ambulatory Visit (INDEPENDENT_AMBULATORY_CARE_PROVIDER_SITE_OTHER): Payer: Medicare Other | Admitting: Podiatry

## 2022-02-26 ENCOUNTER — Other Ambulatory Visit: Payer: Self-pay | Admitting: *Deleted

## 2022-02-26 DIAGNOSIS — L600 Ingrowing nail: Secondary | ICD-10-CM

## 2022-02-26 DIAGNOSIS — M79609 Pain in unspecified limb: Secondary | ICD-10-CM

## 2022-02-26 DIAGNOSIS — I739 Peripheral vascular disease, unspecified: Secondary | ICD-10-CM | POA: Diagnosis not present

## 2022-02-26 DIAGNOSIS — B351 Tinea unguium: Secondary | ICD-10-CM

## 2022-02-26 NOTE — Patient Instructions (Signed)
EPSOM SALT FOOT SOAK INSTRUCTIONS  *IF YOU HAVE BEEN PRESCRIBED ANTIBIOTICS, TAKE AS INSTRUCTED UNTIL ALL ARE GONE*  Shopping List:  A. Plain epsom salt (not scented) B. Neosporin Cream/Ointment or Bacitracin Cream/Ointment (or prescribed antiobiotic drops/cream/ointment) C. 1-inch fabric band-aids   Place 1/4 cup of epsom salts in 2 quarts of warm tap water. IF YOU ARE DIABETIC, OR HAVE NEUROPATHY, CHECK THE TEMPERATURE OF THE WATER WITH YOUR ELBOW.   Submerge your foot/feet in the solution and soak for 10-15 minutes.      3.  Next, remove your foot/feet from solution, blot dry the affected area.    4.  Apply light amount of antibiotic cream/ointment and cover with fabric band-aid .  5.  This soak should be done once a day for 7 days.   6.  Monitor for any signs/symptoms of infection such as redness, swelling, odor, drainage, increased pain, or non-healing of digit.   7.  Please do not hesitate to call the office and speak to a Nurse or Doctor if you have questions.   8.  If you experience fever, chills, nightsweats, nausea or vomiting with worsening of digit/foot, please go to the emergency room.  

## 2022-03-02 DIAGNOSIS — J449 Chronic obstructive pulmonary disease, unspecified: Secondary | ICD-10-CM | POA: Diagnosis not present

## 2022-03-02 DIAGNOSIS — H903 Sensorineural hearing loss, bilateral: Secondary | ICD-10-CM | POA: Diagnosis not present

## 2022-03-02 DIAGNOSIS — I10 Essential (primary) hypertension: Secondary | ICD-10-CM | POA: Diagnosis not present

## 2022-03-02 DIAGNOSIS — H6983 Other specified disorders of Eustachian tube, bilateral: Secondary | ICD-10-CM | POA: Diagnosis not present

## 2022-03-02 DIAGNOSIS — H9190 Unspecified hearing loss, unspecified ear: Secondary | ICD-10-CM | POA: Diagnosis not present

## 2022-03-02 DIAGNOSIS — H9319 Tinnitus, unspecified ear: Secondary | ICD-10-CM | POA: Diagnosis not present

## 2022-03-02 DIAGNOSIS — F17200 Nicotine dependence, unspecified, uncomplicated: Secondary | ICD-10-CM | POA: Diagnosis not present

## 2022-03-02 DIAGNOSIS — H906 Mixed conductive and sensorineural hearing loss, bilateral: Secondary | ICD-10-CM | POA: Diagnosis not present

## 2022-03-02 DIAGNOSIS — Z789 Other specified health status: Secondary | ICD-10-CM | POA: Diagnosis not present

## 2022-03-03 ENCOUNTER — Ambulatory Visit
Admission: RE | Admit: 2022-03-03 | Discharge: 2022-03-03 | Disposition: A | Discharge status: Home / Self Care | Payer: Medicare Other | Source: Ambulatory Visit | Attending: Nurse Practitioner | Admitting: Nurse Practitioner

## 2022-03-03 DIAGNOSIS — M79662 Pain in left lower leg: Secondary | ICD-10-CM | POA: Diagnosis not present

## 2022-03-03 DIAGNOSIS — I739 Peripheral vascular disease, unspecified: Secondary | ICD-10-CM | POA: Insufficient documentation

## 2022-03-03 DIAGNOSIS — R200 Anesthesia of skin: Secondary | ICD-10-CM | POA: Diagnosis not present

## 2022-03-03 DIAGNOSIS — R202 Paresthesia of skin: Secondary | ICD-10-CM | POA: Diagnosis not present

## 2022-03-03 LAB — POCT I-STAT CREATININE: Creatinine, Ser: 0.7 mg/dL (ref 0.44–1.00)

## 2022-03-03 MED ORDER — IOHEXOL 350 MG/ML SOLN
125.0000 mL | Freq: Once | INTRAVENOUS | Status: AC | PRN
  Administered 2022-03-03: 125 mL via INTRAVENOUS

## 2022-03-04 DIAGNOSIS — E875 Hyperkalemia: Secondary | ICD-10-CM | POA: Diagnosis not present

## 2022-03-08 NOTE — Progress Notes (Signed)
?  Subjective:  ?Patient ID: Angela Pitts, female    DOB: 11/04/1955,  MRN: [id number] ? ?Angela Pitts presents to clinic today for for at risk foot care. Patient has h/o PAD and ingrown toenail to the bilateral great toes. ? ?Patient states she will be having more testing done on her circulation next week. ? ?New problem(s): None.  ? ?PCP is Cyndi Bender, PA-C , and last visit was last week. ? ?No Known Allergies ? ?Review of Systems: Negative except as noted in the HPI. ? ?Objective: No changes noted in today's physical examination. ?Vascular Examination: ?CFT <4 seconds b/l. DP pulses palpable b/l. PT pulses diminished b/l. Digital hair absent. Skin temperature gradient warm to cool b/l. No ischemia or gangrene. No cyanosis or clubbing noted b/l. +1 pitting edema noted BLE.  ? ?Neurological Examination: ?Sensation grossly intact b/l with 10 gram monofilament. Vibratory sensation intact b/l. Protective sensation intact 5/5 intact bilaterally with 10g monofilament b/l. Vibratory sensation intact b/l. ? ?Dermatological Examination: ?Pedal skin thin, shiny and atrophic b/l. Toenails 1-5 b/l thick, discolored, elongated with subungual debris and pain on dorsal palpation.  Pedal integument with normal turgor, texture and tone BLE. No open wounds b/l LE. No interdigital macerations noted b/l LE. Toenails 1-5 bilaterally elongated, discolored, dystrophic, thickened, and crumbly with subungual debris and tenderness to dorsal palpation. Incurvated nailplate both borders of left hallux and both borders of right hallux.  Nail border hypertrophy absent. There is tenderness to palpation. Sign(s) of infection: no clinical signs of infection noted on examination today.. ? ?Musculoskeletal Examination: ?Muscle strength 5/5 to b/l LE. Muscle strength 5/5 to all lower extremity muscle groups bilaterally. No pain, crepitus or joint limitation noted with ROM bilateral LE. No gross bony deformities  bilaterally. ? ?Radiographs: None ? ?Assessment/Plan: ?1. Pain due to onychomycosis of nail   ?2. Ingrown nail   ?3. PAD (peripheral artery disease) ([place])   ?  ?-Patient was evaluated and treated. All patient's and/or POA's questions/concerns answered on today's visit. ?-Patient to continue soft, supportive shoe gear daily. ?-Toenails 2-5 bilaterally debrided in length and girth without iatrogenic bleeding with sterile nail nipper and dremel.  ?-No invasive procedure performed. Offending nail border debrided and curretaged bilateral great toes utilizing sterile nail nipper and currette. Border(s) cleansed with alcohol and TAO applied. Dispensed written instructions for once daily epsom salt soaks for 7 days. Call office if there are any concerns. ?-Patient/POA to call should there be question/concern in the interim.  ? ?Return in about 3 months (around 05/29/2022). ? ?Marzetta Board, DPM  ?

## 2022-03-10 ENCOUNTER — Ambulatory Visit (INDEPENDENT_AMBULATORY_CARE_PROVIDER_SITE_OTHER): Payer: Medicare Other | Admitting: Sports Medicine

## 2022-03-10 ENCOUNTER — Encounter: Payer: Self-pay | Admitting: Sports Medicine

## 2022-03-10 DIAGNOSIS — I739 Peripheral vascular disease, unspecified: Secondary | ICD-10-CM | POA: Diagnosis not present

## 2022-03-10 DIAGNOSIS — M79674 Pain in right toe(s): Secondary | ICD-10-CM | POA: Diagnosis not present

## 2022-03-10 DIAGNOSIS — L600 Ingrowing nail: Secondary | ICD-10-CM

## 2022-03-10 DIAGNOSIS — M79675 Pain in left toe(s): Secondary | ICD-10-CM

## 2022-03-10 NOTE — Progress Notes (Signed)
Subjective: ?Angela Pitts is a 66 y.o. female patient presents to office today complaining of a moderately painful incurvated, medial and lateral hallux nails borderers that are really sore. This has been present for chronically. Patient has treated this by nail trimming.Patient denies fever/chills/nausea/vomitting/any other related constitutional symptoms at this time. Patient had CT angio. No other issues noted.  ? ?Patient Active Problem List  ? Diagnosis Date Noted  ? Absence of bladder continence 11/17/2021  ? Acid reflux 11/17/2021  ? Allergic rhinitis 11/17/2021  ? Arthritis of knee 11/17/2021  ? Benign essential HTN 11/17/2021  ? Blood glucose elevated 11/17/2021  ? Body mass index of 60 or higher 11/17/2021  ? Cervical cancer screening 11/17/2021  ? CN (constipation) 11/17/2021  ? Combined fat and carbohydrate induced hyperlipemia 11/17/2021  ? Cough 11/17/2021  ? Current tobacco use 11/17/2021  ? Screening for gout 11/17/2021  ? Flu vaccine need 11/17/2021  ? Gonalgia 11/17/2021  ? N&V (nausea and vomiting) 11/17/2021  ? Osteopenia 11/17/2021  ? Polypharmacy 11/17/2021  ? Acute calculous cholecystitis 05/24/2019  ? Postoperative examination 05/24/2019  ? Abnormal CT of the chest 03/03/2017  ? Abnormal laboratory test 03/03/2017  ? Chronic obstructive pulmonary disease, unspecified ([place]) 03/03/2017  ? Elevated rheumatoid factor 03/03/2017  ? Localized osteoarthritis of left knee 03/03/2017  ? Positive ANA (antinuclear antibody) 03/03/2017  ? ? ?Current Outpatient Medications on File Prior to Visit  ?Medication Sig Dispense Refill  ? ACETAMINOPHEN EXTRA STRENGTH 500 MG tablet Take 1,000 mg by mouth 3 (three) times daily as needed.    ? albuterol (VENTOLIN HFA) 108 (90 Base) MCG/ACT inhaler Inhale into the lungs.    ? ALPRAZolam (XANAX) 0.5 MG tablet Take by mouth.    ? cetirizine (ZYRTEC) 10 MG tablet Take by mouth.    ? fluticasone (FLONASE) 50 MCG/ACT nasal spray Place into the nose.    ?  fluticasone-salmeterol (ADVAIR HFA) 230-21 MCG/ACT inhaler Inhale into the lungs.    ? fluticasone-salmeterol (ADVAIR) 500-50 MCG/ACT AEPB INHALE 1 PUFF INTO THE LUNGS EVERY 12 HOURS    ? furosemide (LASIX) 40 MG tablet Take by mouth.    ? ipratropium (ATROVENT) 0.02 % nebulizer solution Inhale into the lungs.    ? ipratropium-albuterol (DUONEB) 0.5-2.5 (3) MG/3ML SOLN Inhale into the lungs.    ? lisinopril (ZESTRIL) 20 MG tablet     ? loratadine (CLARITIN) 10 MG tablet Take by mouth.    ? lubiprostone (AMITIZA) 24 MCG capsule TK 1 C PO BID WF AND WATER    ? montelukast (SINGULAIR) 10 MG tablet Take by mouth.    ? neomycin-polymyxin b-dexamethasone (MAXITROL) 3.5-10000-0.1 SUSP SMARTSIG:2 Drop(s) In Ear(s) 4 Times Daily    ? nitroGLYCERIN (NITROSTAT) 0.4 MG SL tablet Place under the tongue.    ? omeprazole (PRILOSEC OTC) 20 MG tablet Take by mouth.    ? oxyCODONE (OXY IR/ROXICODONE) 5 MG immediate release tablet Take 5 mg by mouth every 4 (four) hours as needed.    ? potassium chloride (KLOR-CON M) 10 MEQ tablet daily.    ? predniSONE (DELTASONE) 10 MG tablet     ? psyllium (KONSYL) 33 % POWD Take by mouth.    ? rosuvastatin (CRESTOR) 5 MG tablet Take 5 mg by mouth daily.    ? sulfamethoxazole-trimethoprim (BACTRIM DS) 800-160 MG tablet Take 1 tablet by mouth 2 (two) times daily.    ? tiotropium (SPIRIVA HANDIHALER) 18 MCG inhalation capsule Place into inhaler and inhale.    ? umeclidinium  bromide (INCRUSE ELLIPTA) 62.5 MCG/ACT AEPB INHALE 1 PUFF INTO THE LUNGS ONCE DAILY    ? ?No current facility-administered medications on file prior to visit.  ? ? ?No Known Allergies ? ?Objective:  ?There were no vitals filed for this visit. ? ?General: Well developed, nourished, in no acute distress, alert and oriented x3 on oxygen history of COPD.  ? ?Dermatology: Skin is warm, dry and supple bilateral. Right and left hallux nail appears incurvated at the distal aspects of the medial and lateral nail border. (-) Erythema. (+)  Edema. (-) serosanguous drainage present. The remaining nails appear unremarkable at this time, short and thick consistent with onychomycosis. There are no open sores, lesions or other signs of infection present. ? ?Vascular: Dorsalis Pedis artery and Posterior Tibial artery pedal pulses are 0/4 due to 1+ pitting edema, CT angio negative for any occlusive arterial disease.   ? ?Neruologic: Grossly intact via light touch bilateral. ? ?Musculoskeletal: Tenderness to palpation of the right and left hallux medial and lateral nail fold.  Muscular strength within normal limits in all groups bilateral.  ? ?Assesement and Plan: ?Problem List Items Addressed This Visit   ?None ?Visit Diagnoses   ? ? Ingrown nail    -  Primary  ? PVD (peripheral vascular disease) ([place])      ? CT Angio negative no occlusive disease   ? Toe pain, bilateral      ? ?  ? ? ?-Discussed treatment alternatives and plan of care; Explained permanent/temporary nail avulsion and post procedure course to patient. Patient elects for PNA right and left hallux medial and lateral nail borders with phenol ?- After a verbal and written consent, injected 3 ml of a 50:50 mixture of 2% plain  ?lidocaine and 0.5% plain marcaine in a normal hallux block fashion. Next, a  ?betadine prep was performed. Anesthesia was tested and found to be appropriate.  ?The offending -right and left medial and lateral hallux nail border was then incised from the hyponychium to the epinychium. The offending nail border was removed and cleared from the field. The area was curretted for any remaining nail or spicules. Phenol application performed and the area was then flushed with alcohol and dressed with antibiotic cream and a dry sterile dressing. ?-Patient was instructed to leave the dressing intact for today and begin soaking  ?in a weak solution of betadine or Epsom salt and water tomorrow. Patient was instructed to soak for 15-20 minutes each day and apply neosporin/corticosporin  and a gauze or bandaid dressing each day. ?-Patient was instructed to monitor the toe for signs of infection and return to office if toe becomes red, hot or swollen. ?-Advised ice, elevation, and tylenol or motrin if needed for pain.  ?-Patient is to return in 2 weeks for follow up care/nail check or sooner if problems arise. ? ?Landis Martins, DPM ?

## 2022-03-10 NOTE — Patient Instructions (Signed)

## 2022-03-17 DIAGNOSIS — J209 Acute bronchitis, unspecified: Secondary | ICD-10-CM | POA: Diagnosis not present

## 2022-03-25 ENCOUNTER — Ambulatory Visit (INDEPENDENT_AMBULATORY_CARE_PROVIDER_SITE_OTHER): Payer: Medicare Other | Admitting: Sports Medicine

## 2022-03-25 ENCOUNTER — Encounter: Payer: Self-pay | Admitting: Sports Medicine

## 2022-03-25 DIAGNOSIS — I739 Peripheral vascular disease, unspecified: Secondary | ICD-10-CM

## 2022-03-25 DIAGNOSIS — M79675 Pain in left toe(s): Secondary | ICD-10-CM

## 2022-03-25 DIAGNOSIS — M79674 Pain in right toe(s): Secondary | ICD-10-CM

## 2022-03-25 DIAGNOSIS — Z9889 Other specified postprocedural states: Secondary | ICD-10-CM

## 2022-03-25 MED ORDER — AMOXICILLIN-POT CLAVULANATE 875-125 MG PO TABS: 1.0000 | ORAL_TABLET | Freq: Two times a day (BID) | ORAL | 0 refills | Status: DC

## 2022-03-25 NOTE — Progress Notes (Signed)
Subjective: Angela Pitts is a 66 y.o. female patient returns to office today for follow up evaluation after having Right/Left Hallux medial/lateral permanent nail avulsion performed on (03/10/2022). Patient has been soaking using soapy water because Epsom salt made her swell.  Patient deniesfever/chills/nausea/vomitting/any other related constitutional symptoms at this time.  Patient Active Problem List   Diagnosis Date Noted   Absence of bladder continence 11/17/2021   Acid reflux 11/17/2021   Allergic rhinitis 11/17/2021   Arthritis of knee 11/17/2021   Benign essential HTN 11/17/2021   Blood glucose elevated 11/17/2021   Body mass index of 60 or higher 11/17/2021   Cervical cancer screening 11/17/2021   CN (constipation) 11/17/2021   Combined fat and carbohydrate induced hyperlipemia 11/17/2021   Cough 11/17/2021   Current tobacco use 11/17/2021   Screening for gout 11/17/2021   Flu vaccine need 11/17/2021   Gonalgia 11/17/2021   N&V (nausea and vomiting) 11/17/2021   Osteopenia 11/17/2021   Polypharmacy 11/17/2021   Acute calculous cholecystitis 05/24/2019   Postoperative examination 05/24/2019   Abnormal CT of the chest 03/03/2017   Abnormal laboratory test 03/03/2017   Chronic obstructive pulmonary disease, unspecified ([place]) 03/03/2017   Elevated rheumatoid factor 03/03/2017   Localized osteoarthritis of left knee 03/03/2017   Positive ANA (antinuclear antibody) 03/03/2017    Current Outpatient Medications on File Prior to Visit  Medication Sig Dispense Refill   ACETAMINOPHEN EXTRA STRENGTH 500 MG tablet Take 1,000 mg by mouth 3 (three) times daily as needed.     albuterol (VENTOLIN HFA) 108 (90 Base) MCG/ACT inhaler Inhale into the lungs.     ALPRAZolam (XANAX) 0.5 MG tablet Take by mouth.     cetirizine (ZYRTEC) 10 MG tablet Take by mouth.     fluticasone (FLONASE) 50 MCG/ACT nasal spray Place into the nose.     fluticasone-salmeterol (ADVAIR HFA) 230-21 MCG/ACT  inhaler Inhale into the lungs.     fluticasone-salmeterol (ADVAIR) 500-50 MCG/ACT AEPB INHALE 1 PUFF INTO THE LUNGS EVERY 12 HOURS     furosemide (LASIX) 40 MG tablet Take by mouth.     ipratropium (ATROVENT) 0.02 % nebulizer solution Inhale into the lungs.     ipratropium-albuterol (DUONEB) 0.5-2.5 (3) MG/3ML SOLN Inhale into the lungs.     lisinopril (ZESTRIL) 20 MG tablet      loratadine (CLARITIN) 10 MG tablet Take by mouth.     lubiprostone (AMITIZA) 24 MCG capsule TK 1 C PO BID WF AND WATER     montelukast (SINGULAIR) 10 MG tablet Take by mouth.     neomycin-polymyxin b-dexamethasone (MAXITROL) 3.5-10000-0.1 SUSP SMARTSIG:2 Drop(s) In Ear(s) 4 Times Daily     nitroGLYCERIN (NITROSTAT) 0.4 MG SL tablet Place under the tongue.     omeprazole (PRILOSEC OTC) 20 MG tablet Take by mouth.     oxyCODONE (OXY IR/ROXICODONE) 5 MG immediate release tablet Take 5 mg by mouth every 4 (four) hours as needed.     potassium chloride (KLOR-CON M) 10 MEQ tablet daily.     predniSONE (DELTASONE) 10 MG tablet      psyllium (KONSYL) 33 % POWD Take by mouth.     rosuvastatin (CRESTOR) 5 MG tablet Take 5 mg by mouth daily.     sulfamethoxazole-trimethoprim (BACTRIM DS) 800-160 MG tablet Take 1 tablet by mouth 2 (two) times daily.     tiotropium (SPIRIVA HANDIHALER) 18 MCG inhalation capsule Place into inhaler and inhale.     umeclidinium bromide (INCRUSE ELLIPTA) 62.5 MCG/ACT AEPB INHALE 1 PUFF  INTO THE LUNGS ONCE DAILY     No current facility-administered medications on file prior to visit.    No Known Allergies  Objective:  General: Well developed, nourished, in no acute distress, alert and oriented x3 on oxygen  Dermatology: Skin is warm, dry and supple bilateral.  Right and left hallux medial and lateral nail beds appear to be clean, dry, with mild granular tissue and surrounding eschar/scab. (+) Erythema. (+) Edema. (-) serosanguous drainage present. The remaining nails appear unremarkable at this  time. There are no other lesions or other signs of infection  present.  Neurovascular status: Intact. No lower extremity swelling; No pain with calf compression bilateral.  Musculoskeletal: Decreased tenderness to palpation of the right and left hallux medial and lateral nail folds  Assesement and Plan: Problem List Items Addressed This Visit   None Visit Diagnoses     Status post nail surgery    -  Primary   Toe pain, bilateral       PVD (peripheral vascular disease) ([place])           -Examined patient  -Cleansed right/left hallux medial/lateral nail fold and gently scrubbed with peroxide and q-tip/curetted away eschar at site and applied antibiotic cream covered with bandaid.  -Discussed plan of care with patient. -Patient to continue soaking with soapy water for 1 more week and advised patient to pat dry to help remove buildup or scab at nail margin.  Continue with antibiotic ointment and Band-Aid.  May leave open to air at night. -For preventative measures I did prescribe Augmentin since there was significant redness and swelling to the proximal nail fold -Educated patient on long term care after nail surgery. -Patient was instructed to monitor the toe for reoccurrence and signs of infection; Patient advised to return to office or go to ER if toe becomes more red, hot or swollen. -Patient is to return in 2 to 3 weeks for final nail check since I prescribed antibiotics this visit due to the localized erythema or sooner if problems arise.  Landis Martins, DPM

## 2022-03-27 DIAGNOSIS — H6983 Other specified disorders of Eustachian tube, bilateral: Secondary | ICD-10-CM | POA: Diagnosis not present

## 2022-03-27 DIAGNOSIS — H9190 Unspecified hearing loss, unspecified ear: Secondary | ICD-10-CM | POA: Diagnosis not present

## 2022-04-15 ENCOUNTER — Encounter: Payer: Self-pay | Admitting: Podiatrist

## 2022-04-15 ENCOUNTER — Ambulatory Visit (INDEPENDENT_AMBULATORY_CARE_PROVIDER_SITE_OTHER): Payer: Medicare Other | Admitting: Podiatrist

## 2022-04-15 DIAGNOSIS — Z9889 Other specified postprocedural states: Secondary | ICD-10-CM

## 2022-04-15 DIAGNOSIS — L600 Ingrowing nail: Secondary | ICD-10-CM | POA: Diagnosis not present

## 2022-04-15 MED ORDER — MUPIROCIN 2 % EX OINT: 1.0000 | TOPICAL_OINTMENT | Freq: Two times a day (BID) | CUTANEOUS | 2 refills | Status: DC

## 2022-04-17 DIAGNOSIS — J209 Acute bronchitis, unspecified: Secondary | ICD-10-CM | POA: Diagnosis not present

## 2022-04-23 NOTE — Progress Notes (Signed)
Chief Complaint  Patient presents with   Nail Problem    I am doing a lot better on both big toenails and there is some scabbing      HPI: Patient is 66 y.o. female who presents today for recheck of both great toenails after partial permanent nail removals in may.  She relates there is no drainage and a scab forms that she takes off and it comes right back.    No Known Allergies  Review of systems is negative except as noted in the HPI.  Denies nausea/ vomiting/ fevers/ chills or night sweats.   Denies difficulty breathing, denies calf pain or tenderness  Physical Exam  Patient is awake, alert, and oriented x 3.  In no acute distress.    Vascular status is intact with palpable pedal pulses DP and PT bilateral and capillary refill time less than 3 seconds bilateral.  No edema or erythema noted.   Neurological exam reveals epicritic and protective sensation grossly intact bilateral.   Dermatological exam reveals skin is supple and dry to bilateral feet.  Bilateral hallux nails have yellow slough consistent with healing phenol matrixectomies.  No redness, no swelling and no sign of infection is present.    Assessment: Well healing phenol matrixectomy bilateral  Plan: I recommended use of hydrogen peroxide to try and gently dissolve the eschar. I advised against peeling off the scabs.  She will also soak in epsom salt soaks as well as use mupirocin ointment on the toes for the next 10 days.  If she notices any increased redness or swelling she will call, otherwise these should go on to heal uneventfully.

## 2022-05-14 DIAGNOSIS — R7303 Prediabetes: Secondary | ICD-10-CM | POA: Diagnosis not present

## 2022-05-14 DIAGNOSIS — R700 Elevated erythrocyte sedimentation rate: Secondary | ICD-10-CM | POA: Diagnosis not present

## 2022-05-17 DIAGNOSIS — J209 Acute bronchitis, unspecified: Secondary | ICD-10-CM | POA: Diagnosis not present

## 2022-05-26 DIAGNOSIS — E782 Mixed hyperlipidemia: Secondary | ICD-10-CM | POA: Diagnosis not present

## 2022-05-26 DIAGNOSIS — Z1231 Encounter for screening mammogram for malignant neoplasm of breast: Secondary | ICD-10-CM | POA: Diagnosis not present

## 2022-05-26 DIAGNOSIS — R600 Localized edema: Secondary | ICD-10-CM | POA: Diagnosis not present

## 2022-05-26 DIAGNOSIS — R7303 Prediabetes: Secondary | ICD-10-CM | POA: Diagnosis not present

## 2022-05-26 DIAGNOSIS — I10 Essential (primary) hypertension: Secondary | ICD-10-CM | POA: Diagnosis not present

## 2022-05-26 DIAGNOSIS — J449 Chronic obstructive pulmonary disease, unspecified: Secondary | ICD-10-CM | POA: Diagnosis not present

## 2022-06-04 ENCOUNTER — Ambulatory Visit: Payer: Medicare Other | Admitting: Podiatry

## 2022-06-11 ENCOUNTER — Ambulatory Visit (INDEPENDENT_AMBULATORY_CARE_PROVIDER_SITE_OTHER): Payer: Medicare Other | Admitting: Podiatry

## 2022-06-11 DIAGNOSIS — Z91199 Patient's noncompliance with other medical treatment and regimen due to unspecified reason: Secondary | ICD-10-CM

## 2022-06-16 NOTE — Progress Notes (Signed)
1. No-show for appointment     

## 2022-06-17 DIAGNOSIS — T2000XA Burn of unspecified degree of head, face, and neck, unspecified site, initial encounter: Secondary | ICD-10-CM | POA: Diagnosis not present

## 2022-06-17 DIAGNOSIS — J9611 Chronic respiratory failure with hypoxia: Secondary | ICD-10-CM | POA: Diagnosis not present

## 2022-06-17 DIAGNOSIS — T2010XA Burn of first degree of head, face, and neck, unspecified site, initial encounter: Secondary | ICD-10-CM | POA: Diagnosis not present

## 2022-06-17 DIAGNOSIS — J209 Acute bronchitis, unspecified: Secondary | ICD-10-CM | POA: Diagnosis not present

## 2022-06-18 DIAGNOSIS — J449 Chronic obstructive pulmonary disease, unspecified: Secondary | ICD-10-CM | POA: Diagnosis not present

## 2022-06-18 DIAGNOSIS — R069 Unspecified abnormalities of breathing: Secondary | ICD-10-CM | POA: Diagnosis not present

## 2022-06-18 DIAGNOSIS — T2014XA Burn of first degree of nose (septum), initial encounter: Secondary | ICD-10-CM | POA: Diagnosis not present

## 2022-06-18 DIAGNOSIS — R404 Transient alteration of awareness: Secondary | ICD-10-CM | POA: Diagnosis not present

## 2022-06-18 DIAGNOSIS — R0902 Hypoxemia: Secondary | ICD-10-CM | POA: Diagnosis not present

## 2022-06-18 DIAGNOSIS — T300 Burn of unspecified body region, unspecified degree: Secondary | ICD-10-CM | POA: Diagnosis not present

## 2022-06-18 DIAGNOSIS — J3489 Other specified disorders of nose and nasal sinuses: Secondary | ICD-10-CM | POA: Diagnosis not present

## 2022-06-18 DIAGNOSIS — Z743 Need for continuous supervision: Secondary | ICD-10-CM | POA: Diagnosis not present

## 2022-06-18 DIAGNOSIS — T2024XA Burn of second degree of nose (septum), initial encounter: Secondary | ICD-10-CM | POA: Diagnosis not present

## 2022-06-25 DIAGNOSIS — J449 Chronic obstructive pulmonary disease, unspecified: Secondary | ICD-10-CM | POA: Diagnosis not present

## 2022-06-25 DIAGNOSIS — I10 Essential (primary) hypertension: Secondary | ICD-10-CM | POA: Diagnosis not present

## 2022-07-03 DIAGNOSIS — J449 Chronic obstructive pulmonary disease, unspecified: Secondary | ICD-10-CM | POA: Diagnosis not present

## 2022-07-03 DIAGNOSIS — T2004XA Burn of unspecified degree of nose (septum), initial encounter: Secondary | ICD-10-CM | POA: Diagnosis not present

## 2022-07-03 DIAGNOSIS — H6993 Unspecified Eustachian tube disorder, bilateral: Secondary | ICD-10-CM | POA: Diagnosis not present

## 2022-07-03 DIAGNOSIS — H6121 Impacted cerumen, right ear: Secondary | ICD-10-CM | POA: Diagnosis not present

## 2022-07-03 DIAGNOSIS — J3489 Other specified disorders of nose and nasal sinuses: Secondary | ICD-10-CM | POA: Diagnosis not present

## 2022-07-03 DIAGNOSIS — Z789 Other specified health status: Secondary | ICD-10-CM | POA: Diagnosis not present

## 2022-07-03 DIAGNOSIS — H9190 Unspecified hearing loss, unspecified ear: Secondary | ICD-10-CM | POA: Diagnosis not present

## 2022-07-18 DIAGNOSIS — J209 Acute bronchitis, unspecified: Secondary | ICD-10-CM | POA: Diagnosis not present

## 2022-07-22 DIAGNOSIS — Z1231 Encounter for screening mammogram for malignant neoplasm of breast: Secondary | ICD-10-CM | POA: Diagnosis not present

## 2022-07-22 DIAGNOSIS — Z7952 Long term (current) use of systemic steroids: Secondary | ICD-10-CM | POA: Diagnosis not present

## 2022-07-29 DIAGNOSIS — Z9981 Dependence on supplemental oxygen: Secondary | ICD-10-CM | POA: Diagnosis not present

## 2022-07-29 DIAGNOSIS — Z87891 Personal history of nicotine dependence: Secondary | ICD-10-CM | POA: Diagnosis not present

## 2022-07-29 DIAGNOSIS — R600 Localized edema: Secondary | ICD-10-CM | POA: Diagnosis not present

## 2022-07-29 DIAGNOSIS — R0602 Shortness of breath: Secondary | ICD-10-CM | POA: Diagnosis not present

## 2022-07-29 DIAGNOSIS — J449 Chronic obstructive pulmonary disease, unspecified: Secondary | ICD-10-CM | POA: Diagnosis not present

## 2022-08-06 ENCOUNTER — Ambulatory Visit (INDEPENDENT_AMBULATORY_CARE_PROVIDER_SITE_OTHER): Payer: Medicare Other | Admitting: Podiatry

## 2022-08-06 ENCOUNTER — Encounter: Payer: Self-pay | Admitting: Podiatry

## 2022-08-06 DIAGNOSIS — I739 Peripheral vascular disease, unspecified: Secondary | ICD-10-CM

## 2022-08-06 DIAGNOSIS — M79609 Pain in unspecified limb: Secondary | ICD-10-CM | POA: Diagnosis not present

## 2022-08-06 DIAGNOSIS — L601 Onycholysis: Secondary | ICD-10-CM

## 2022-08-06 DIAGNOSIS — B351 Tinea unguium: Secondary | ICD-10-CM

## 2022-08-06 NOTE — Progress Notes (Signed)
  Subjective:  Patient ID: Angela Pitts, female    DOB: 1955/12/05,  MRN: [id number]  Angela Pitts presents to clinic today for:  Chief Complaint  Patient presents with   Nail Problem    Routine foot care PCP-Nathan Tobie Lords PCP VST-2 Weeks ago   She presents with chief concern of injury to L hallux. Patient is unsure when injury occurred or how it happened. She just noticed blood under the toenail. She did not treat it due to having an upcoming appointment  PCP is Cyndi Bender, PA-C.  No Known Allergies  Review of Systems: Negative except as noted in the HPI.  Objective: No changes noted in today's physical examination.  Angela Pitts is a pleasant 66 y.o. female morbidly obese in NAD. AAO x 3.  Vascular Examination: CFT <4 seconds b/l. DP pulses palpable b/l. PT pulses diminished b/l. Digital hair absent. Skin temperature gradient warm to cool b/l. No ischemia or gangrene. No cyanosis or clubbing noted b/l. +1 pitting edema noted BLE.   Neurological Examination: Sensation grossly intact b/l with 10 gram monofilament. Vibratory sensation intact b/l. Protective sensation intact 5/5 intact bilaterally with 10g monofilament b/l. Vibratory sensation intact b/l.  Dermatological Examination: Pedal skin thin, shiny and atrophic b/l. Toenails 1-5 b/l thick, discolored, elongated with subungual debris and pain on dorsal palpation.  Pedal integument with normal turgor, texture and tone BLE. No open wounds b/l LE. No interdigital macerations noted b/l LE.   Toenails right great toe and 2-5 bilaterally elongated, discolored, dystrophic, thickened, and crumbly with subungual debris and tenderness to dorsal palpation.   Evidence of partial matrixectomy bilateral borders right hallux.  There is noted onchyolysis of entire nailplate of left great toe.  The nailbed remains intact. There is no erythema, no edema, no drainage, no underlying fluctuance.  Musculoskeletal  Examination: Muscle strength 5/5 to b/l LE. Muscle strength 5/5 to all lower extremity muscle groups bilaterally. No pain, crepitus or joint limitation noted with ROM bilateral LE. No gross bony deformities bilaterally.  Radiographs: None  Assessment/Plan: 1. Pain due to onychomycosis of nail   2. Onycholysis of toenail   3. PAD (peripheral artery disease) ([place])     No orders of the defined types were placed in this encounter.   -Patient was evaluated and treated. All patient's and/or POA's questions/concerns answered on today's visit. -Patient to continue soft, supportive shoe gear daily. -Toenails 2-5 bilaterally and right great toe debrided in length and girth without iatrogenic bleeding with sterile nail nipper and dremel.  -Loose nailplate L hallux gently debrided entoto without requirement of local anesthesia. Digit nailbed cleansed with alcohol. Triple antibiotic ointment applied to nailbed followed by light dressing. No further treatment required by patient. -Patient/POA to call should there be question/concern in the interim.   Return in about 3 months (around 11/06/2022).  Marzetta Board, DPM

## 2022-08-11 DIAGNOSIS — Z139 Encounter for screening, unspecified: Secondary | ICD-10-CM | POA: Diagnosis not present

## 2022-08-11 DIAGNOSIS — Z23 Encounter for immunization: Secondary | ICD-10-CM | POA: Diagnosis not present

## 2022-08-11 DIAGNOSIS — R0789 Other chest pain: Secondary | ICD-10-CM | POA: Diagnosis not present

## 2022-08-11 DIAGNOSIS — R600 Localized edema: Secondary | ICD-10-CM | POA: Diagnosis not present

## 2022-08-17 DIAGNOSIS — J209 Acute bronchitis, unspecified: Secondary | ICD-10-CM | POA: Diagnosis not present

## 2022-09-17 DIAGNOSIS — J209 Acute bronchitis, unspecified: Secondary | ICD-10-CM | POA: Diagnosis not present

## 2022-10-17 DIAGNOSIS — J209 Acute bronchitis, unspecified: Secondary | ICD-10-CM | POA: Diagnosis not present

## 2022-11-17 DIAGNOSIS — J209 Acute bronchitis, unspecified: Secondary | ICD-10-CM | POA: Diagnosis not present

## 2022-11-19 ENCOUNTER — Ambulatory Visit (INDEPENDENT_AMBULATORY_CARE_PROVIDER_SITE_OTHER): Payer: 59 | Admitting: Podiatry

## 2022-11-19 ENCOUNTER — Encounter: Payer: Self-pay | Admitting: Podiatry

## 2022-11-19 VITALS — BP 148/88

## 2022-11-19 DIAGNOSIS — B351 Tinea unguium: Secondary | ICD-10-CM

## 2022-11-19 DIAGNOSIS — I739 Peripheral vascular disease, unspecified: Secondary | ICD-10-CM | POA: Diagnosis not present

## 2022-11-19 DIAGNOSIS — M79609 Pain in unspecified limb: Secondary | ICD-10-CM

## 2022-11-19 DIAGNOSIS — S90414A Abrasion, right lesser toe(s), initial encounter: Secondary | ICD-10-CM

## 2022-11-19 NOTE — Progress Notes (Signed)
  Subjective:  Patient ID: Angela Pitts, female    DOB: 02/12/1956,  MRN: [id number]  Angela Pitts presents to clinic today for at risk foot care. Patient has h/o PAD and painful elongated mycotic toenails 1-5 bilaterally which are tender when wearing enclosed shoe gear. Pain is relieved with periodic professional debridement.  Chief Complaint  Patient presents with   Nail Problem    RFC  Not Diabetic  PCP - Dr Dirk Dress , last OV 08/2022   New problem(s): None.   PCP is Cyndi Bender, PA-C.  No Known Allergies  Review of Systems: Negative except as noted in the HPI.  Objective:  Vitals:   11/19/22 0938  BP: (!) 148/88   Angela Pitts is a pleasant 67 y.o. female morbidly obese in NAD. AAO x 3.  Vascular Examination: CFT <4 seconds b/l. DP pulses palpable b/l. PT pulses diminished b/l. Digital hair absent. Skin temperature gradient warm to cool b/l. No ischemia or gangrene. No cyanosis or clubbing noted b/l. +1 pitting edema noted BLE.   Neurological Examination: Sensation grossly intact b/l with 10 gram monofilament. Vibratory sensation intact b/l. Protective sensation intact 5/5 intact bilaterally with 10g monofilament b/l. Vibratory sensation intact b/l.  Dermatological Examination: Pedal skin thin, shiny and atrophic b/l. Toenails 1-5 b/l thick, discolored, elongated with subungual debris and pain on dorsal palpation.  Pedal integument with normal turgor, texture and tone BLE. No open wounds b/l LE. No interdigital macerations noted b/l LE.   Toenails right great toe and 2-5 bilaterally elongated, discolored, dystrophic, thickened, and crumbly with subungual debris and tenderness to dorsal palpation.   Evidence of partial matrixectomy bilateral borders right hallux.  Dried heme noted distal tip of right 5th digit. No erythema, no edema, no active drainage, no fluctuance.  Musculoskeletal Examination: Muscle strength 5/5 to b/l LE. Muscle strength 5/5 to  all lower extremity muscle groups bilaterally. No pain, crepitus or joint limitation noted with ROM bilateral LE. No gross bony deformities bilaterally.  Radiographs: None  Assessment/Plan: 1. Pain due to onychomycosis of nail   2. Abrasion of fifth toe of right foot, initial encounter   3. PAD (peripheral artery disease) ([place])     No orders of the defined types were placed in this encounter.  -Consent given for treatment as described below: -Examined patient. -Continue supportive shoe gear daily. -Mycotic toenails 1-5 bilaterally were debrided in length and girth with sterile nail nippers and dremel without incident. -For abrasion, patient to apply Neosporin to R 5th toe once daily for one week. -Patient/POA to call should there be question/concern in the interim.   Return in about 3 months (around 02/18/2023).  Marzetta Board, DPM

## 2022-11-30 DIAGNOSIS — R7303 Prediabetes: Secondary | ICD-10-CM | POA: Diagnosis not present

## 2022-11-30 DIAGNOSIS — E782 Mixed hyperlipidemia: Secondary | ICD-10-CM | POA: Diagnosis not present

## 2022-12-02 DIAGNOSIS — R600 Localized edema: Secondary | ICD-10-CM | POA: Diagnosis not present

## 2022-12-02 DIAGNOSIS — E875 Hyperkalemia: Secondary | ICD-10-CM | POA: Diagnosis not present

## 2022-12-02 DIAGNOSIS — I10 Essential (primary) hypertension: Secondary | ICD-10-CM | POA: Diagnosis not present

## 2022-12-02 DIAGNOSIS — R7303 Prediabetes: Secondary | ICD-10-CM | POA: Diagnosis not present

## 2022-12-02 DIAGNOSIS — E782 Mixed hyperlipidemia: Secondary | ICD-10-CM | POA: Diagnosis not present

## 2022-12-02 DIAGNOSIS — J449 Chronic obstructive pulmonary disease, unspecified: Secondary | ICD-10-CM | POA: Diagnosis not present

## 2022-12-02 DIAGNOSIS — Z9181 History of falling: Secondary | ICD-10-CM | POA: Diagnosis not present

## 2022-12-18 DIAGNOSIS — J209 Acute bronchitis, unspecified: Secondary | ICD-10-CM | POA: Diagnosis not present

## 2023-01-16 DIAGNOSIS — J209 Acute bronchitis, unspecified: Secondary | ICD-10-CM | POA: Diagnosis not present

## 2023-01-28 ENCOUNTER — Ambulatory Visit: Payer: 59 | Admitting: Podiatry

## 2023-01-28 ENCOUNTER — Encounter: Payer: Self-pay | Admitting: Podiatry

## 2023-01-28 VITALS — BP 137/89 | HR 85

## 2023-01-28 DIAGNOSIS — I739 Peripheral vascular disease, unspecified: Secondary | ICD-10-CM

## 2023-01-28 DIAGNOSIS — B351 Tinea unguium: Secondary | ICD-10-CM

## 2023-01-28 DIAGNOSIS — L84 Corns and callosities: Secondary | ICD-10-CM | POA: Diagnosis not present

## 2023-01-28 DIAGNOSIS — M79609 Pain in unspecified limb: Secondary | ICD-10-CM | POA: Diagnosis not present

## 2023-01-28 NOTE — Progress Notes (Signed)
  Subjective:  Patient ID: Angela Pitts, female    DOB: Jul 10, 1956,  MRN: [id number]  Renard Hamper presents to clinic today for at risk foot care. Patient has h/o PAD and painful thick toenails that are difficult to trim. Pain interferes with ambulation. Aggravating factors include wearing enclosed shoe gear. Pain is relieved with periodic professional debridement.  Chief Complaint  Patient presents with   routine foot care    New problem(s): None.   PCP is Lonie Peak, PA-C.  No Known Allergies  Review of Systems: Negative except as noted in the HPI.  Objective:  Vitals:   01/28/23 0931  BP: 137/89  Pulse: 85   Angela Pitts is a pleasant 67 y.o. female morbidly obese in NAD. AAO x 3.  Vascular Examination: CFT <4 seconds b/l. DP pulses palpable b/l. PT pulses diminished b/l. Digital hair absent. Skin temperature gradient warm to cool b/l. No ischemia or gangrene. No cyanosis or clubbing noted b/l. +1 pitting edema noted BLE.   Neurological Examination: Sensation grossly intact b/l with 10 gram monofilament. Vibratory sensation intact b/l. Protective sensation intact 5/5 intact bilaterally with 10g monofilament b/l. Vibratory sensation intact b/l.  Dermatological Examination: Pedal skin thin, shiny and atrophic b/l.   Toenails 1-5 b/l thick, discolored, elongated with subungual debris and pain on dorsal palpation.  Pedal integument with normal turgor, texture and tone BLE. No open wounds b/l LE. No interdigital macerations noted b/l LE.   Hyperkeratotic lesion(s) right heel, left great toe, and right great toe.  No erythema, no edema, no drainage, no fluctuance.  Musculoskeletal Examination: Muscle strength 5/5 to b/l LE. Muscle strength 5/5 to all lower extremity muscle groups bilaterally. No pain, crepitus or joint limitation noted with ROM bilateral LE. No gross bony deformities bilaterally. Patient sitting up in wheelchair.  Radiographs:  None  Assessment/Plan: 1. Pain due to onychomycosis of nail   2. Callus   3. PAD (peripheral artery disease)     -Patient was evaluated and treated. All patient's and/or POA's questions/concerns answered on today's visit. -Continue supportive shoe gear daily. -Mycotic toenails 1-5 bilaterally were debrided in length and girth with sterile nail nippers and dremel without incident. -Callus(es) bilateral great toes pared utilizing sharp debridement with sterile blade without complication or incident. Total number debrided =2. -Callus(es) right heel pared utilizing rotary bur without complication or incident. Total number pared =1. -Patient/POA to call should there be question/concern in the interim.   Return in about 3 months (around 04/29/2023).  Freddie Breech, DPM

## 2023-02-16 DIAGNOSIS — J209 Acute bronchitis, unspecified: Secondary | ICD-10-CM | POA: Diagnosis not present

## 2023-03-18 DIAGNOSIS — J209 Acute bronchitis, unspecified: Secondary | ICD-10-CM | POA: Diagnosis not present

## 2023-04-14 DIAGNOSIS — R7303 Prediabetes: Secondary | ICD-10-CM | POA: Diagnosis not present

## 2023-04-14 DIAGNOSIS — E782 Mixed hyperlipidemia: Secondary | ICD-10-CM | POA: Diagnosis not present

## 2023-04-18 DIAGNOSIS — J209 Acute bronchitis, unspecified: Secondary | ICD-10-CM | POA: Diagnosis not present

## 2023-04-19 DIAGNOSIS — R600 Localized edema: Secondary | ICD-10-CM | POA: Diagnosis not present

## 2023-04-19 DIAGNOSIS — I10 Essential (primary) hypertension: Secondary | ICD-10-CM | POA: Diagnosis not present

## 2023-04-19 DIAGNOSIS — E782 Mixed hyperlipidemia: Secondary | ICD-10-CM | POA: Diagnosis not present

## 2023-04-19 DIAGNOSIS — R7303 Prediabetes: Secondary | ICD-10-CM | POA: Diagnosis not present

## 2023-04-19 DIAGNOSIS — J449 Chronic obstructive pulmonary disease, unspecified: Secondary | ICD-10-CM | POA: Diagnosis not present

## 2023-05-05 ENCOUNTER — Ambulatory Visit: Payer: 59 | Admitting: Podiatry

## 2023-05-18 DIAGNOSIS — J209 Acute bronchitis, unspecified: Secondary | ICD-10-CM | POA: Diagnosis not present

## 2023-05-19 ENCOUNTER — Ambulatory Visit (INDEPENDENT_AMBULATORY_CARE_PROVIDER_SITE_OTHER): Payer: 59 | Admitting: Podiatry

## 2023-05-19 DIAGNOSIS — I739 Peripheral vascular disease, unspecified: Secondary | ICD-10-CM | POA: Diagnosis not present

## 2023-05-19 DIAGNOSIS — B351 Tinea unguium: Secondary | ICD-10-CM | POA: Diagnosis not present

## 2023-05-19 DIAGNOSIS — M79609 Pain in unspecified limb: Secondary | ICD-10-CM

## 2023-05-19 NOTE — Progress Notes (Signed)
       Subjective:  Patient ID: Angela Pitts, female    DOB: 1956-07-19,  MRN: [id number]  Angela Pitts presents to clinic today for:  Chief Complaint  Patient presents with   Nail Problem    RFC  . Patient notes nails are thick and elongated, causing pain in shoe gear when ambulating.  She states that she can sometimes ambulate around the house in the morning before swelling starts to get bad in her legs.  As she cannot afford compression stockings.  As she is on oxygen.  States that her son passed away and her daughter-in-law left the area so she is raising her 3 teenage grandsons by herself.  She is in a wheelchair today but she is able to transfer to the exam chair  PCP is Lonie Peak, PA-C.  Last seen within the past 3 months  No Known Allergies  Review of Systems: Negative except as noted in the HPI.  Objective:  There were no vitals filed for this visit.  Angela Pitts is a pleasant 67 y.o. female in NAD. AAO x 3.  Vascular Examination: Patient has palpable DP pulse, absent PT pulse bilateral.  Delayed capillary refill bilateral toes.  Sparse digital hair bilateral.  Proximal to distal cooling WNL bilateral.    Dermatological Examination: Interspaces are clear with no open lesions noted bilateral.  Nails are 3-48mm thick, with yellowish/brown discoloration, subungual debris and distal onycholysis x10.  There is pain with compression of nails x10.  There are some loose, peeling skin around the plantar aspect of the right heel which was removed with a nail nipper.  No surrounding erythema was noted.  Patient qualifies for at-risk foot care because of diabetes with PVD.  Assessment/Plan: 1. Pain due to onychomycosis of nail   2. PVD (peripheral vascular disease) (HCC)     Mycotic nails x10 were sharply debrided with sterile nail nippers and power debriding burr to decrease bulk and length.  Return in about 3 months (around 08/19/2023) for RFC.   Clerance Lav, DPM, FACFAS [facility]     [address] [place], [place] [address]                Office [phone]  Fax [phone]

## 2023-06-18 DIAGNOSIS — J209 Acute bronchitis, unspecified: Secondary | ICD-10-CM | POA: Diagnosis not present

## 2023-07-19 DIAGNOSIS — J209 Acute bronchitis, unspecified: Secondary | ICD-10-CM | POA: Diagnosis not present

## 2023-07-20 DIAGNOSIS — Z9181 History of falling: Secondary | ICD-10-CM | POA: Diagnosis not present

## 2023-07-20 DIAGNOSIS — Z0000 Encounter for general adult medical examination without abnormal findings: Secondary | ICD-10-CM | POA: Diagnosis not present

## 2023-07-20 DIAGNOSIS — Z139 Encounter for screening, unspecified: Secondary | ICD-10-CM | POA: Diagnosis not present

## 2023-08-18 ENCOUNTER — Ambulatory Visit: Payer: 59 | Admitting: Podiatry

## 2023-08-18 ENCOUNTER — Encounter: Payer: Self-pay | Admitting: Podiatry

## 2023-08-18 DIAGNOSIS — M79676 Pain in unspecified toe(s): Secondary | ICD-10-CM | POA: Diagnosis not present

## 2023-08-18 DIAGNOSIS — B351 Tinea unguium: Secondary | ICD-10-CM | POA: Diagnosis not present

## 2023-08-18 DIAGNOSIS — L853 Xerosis cutis: Secondary | ICD-10-CM | POA: Diagnosis not present

## 2023-08-18 DIAGNOSIS — J209 Acute bronchitis, unspecified: Secondary | ICD-10-CM | POA: Diagnosis not present

## 2023-08-18 NOTE — Progress Notes (Signed)
       Subjective:  Patient ID: Angela Pitts, female    DOB: Oct 05, 1956,  MRN: [id number]   Angela Pitts presents to clinic today for:  Chief Complaint  Patient presents with   RFC    Nail trim  . Patient notes nails are thick, discolored, elongated and painful in shoegear when trying to ambulate.    PCP is Lonie Peak, PA-C.  Past Medical History:  Diagnosis Date   Allergic rhinitis    Benign essential hypertension    COPD (chronic obstructive pulmonary disease) (HCC)    GERD (gastroesophageal reflux disease)    Mixed hyperlipidemia    Osteopenia    Prediabetes     Past Surgical History:  Procedure Laterality Date   BLADDER SUSPENSION     CHOLECYSTECTOMY     EYE SURGERY     left hand reconstruction     LIPOMA EXCISION Right    back area    No Known Allergies  Review of Systems: Negative except as noted in the HPI.  Objective:  Angela Pitts is a pleasant 67 y.o. female in NAD. AAO x 3.  Vascular Examination: Capillary refill time is 3-5 seconds to toes bilateral. Palpable pedal pulses b/l LE. Digital hair present b/l.  Skin temperature gradient WNL b/l. No varicosities b/l. No cyanosis noted b/l. +1 pitting edema  Dermatological Examination: Pedal skin with normal turgor, texture and tone b/l. No open wounds. No interdigital macerations b/l. Toenails x10 are 3mm thick, discolored, dystrophic with subungual debris. There is pain with compression of the nail plates.  They are elongated x10.  Debris on plantar aspect of feet bilateral.  Skin is dry and flaky bilateral.  Assessment/Plan: 1. Pain due to onychomycosis of nail   2. Xerosis of skin    The mycotic toenails were sharply debrided x10 with sterile nail nippers and a power debriding burr to decrease bulk/thickness and length.    Recommend Aquaphor or Eucerin spray for the skin on her legs and feet since she can't reach them to apply a cream or lotion.  Return in about 3 months (around  11/18/2023) for RFC.   Clerance Lav, DPM, FACFAS [facility]     [address] [place], [place] [address]                Office [phone]  Fax [phone]

## 2023-09-18 DIAGNOSIS — J209 Acute bronchitis, unspecified: Secondary | ICD-10-CM | POA: Diagnosis not present

## 2023-10-18 DIAGNOSIS — J209 Acute bronchitis, unspecified: Secondary | ICD-10-CM | POA: Diagnosis not present

## 2023-10-18 DIAGNOSIS — F17210 Nicotine dependence, cigarettes, uncomplicated: Secondary | ICD-10-CM | POA: Diagnosis not present

## 2023-10-18 DIAGNOSIS — R918 Other nonspecific abnormal finding of lung field: Secondary | ICD-10-CM | POA: Diagnosis not present

## 2023-10-18 DIAGNOSIS — M1990 Unspecified osteoarthritis, unspecified site: Secondary | ICD-10-CM | POA: Diagnosis not present

## 2023-10-18 DIAGNOSIS — J9622 Acute and chronic respiratory failure with hypercapnia: Secondary | ICD-10-CM | POA: Diagnosis not present

## 2023-10-18 DIAGNOSIS — I110 Hypertensive heart disease with heart failure: Secondary | ICD-10-CM | POA: Diagnosis not present

## 2023-10-18 DIAGNOSIS — J441 Chronic obstructive pulmonary disease with (acute) exacerbation: Secondary | ICD-10-CM | POA: Diagnosis not present

## 2023-10-18 DIAGNOSIS — J984 Other disorders of lung: Secondary | ICD-10-CM | POA: Diagnosis not present

## 2023-10-18 DIAGNOSIS — Z9981 Dependence on supplemental oxygen: Secondary | ICD-10-CM | POA: Diagnosis not present

## 2023-10-18 DIAGNOSIS — Z743 Need for continuous supervision: Secondary | ICD-10-CM | POA: Diagnosis not present

## 2023-10-18 DIAGNOSIS — Z8711 Personal history of peptic ulcer disease: Secondary | ICD-10-CM | POA: Diagnosis not present

## 2023-10-18 DIAGNOSIS — E7800 Pure hypercholesterolemia, unspecified: Secondary | ICD-10-CM | POA: Diagnosis not present

## 2023-10-18 DIAGNOSIS — E870 Hyperosmolality and hypernatremia: Secondary | ICD-10-CM | POA: Diagnosis not present

## 2023-10-18 DIAGNOSIS — J811 Chronic pulmonary edema: Secondary | ICD-10-CM | POA: Diagnosis not present

## 2023-10-18 DIAGNOSIS — I10 Essential (primary) hypertension: Secondary | ICD-10-CM | POA: Diagnosis not present

## 2023-10-18 DIAGNOSIS — Z1152 Encounter for screening for COVID-19: Secondary | ICD-10-CM | POA: Diagnosis not present

## 2023-10-18 DIAGNOSIS — R404 Transient alteration of awareness: Secondary | ICD-10-CM | POA: Diagnosis not present

## 2023-10-18 DIAGNOSIS — G9341 Metabolic encephalopathy: Secondary | ICD-10-CM | POA: Diagnosis not present

## 2023-10-18 DIAGNOSIS — N170 Acute kidney failure with tubular necrosis: Secondary | ICD-10-CM | POA: Diagnosis not present

## 2023-10-18 DIAGNOSIS — R0602 Shortness of breath: Secondary | ICD-10-CM | POA: Diagnosis not present

## 2023-10-18 DIAGNOSIS — N179 Acute kidney failure, unspecified: Secondary | ICD-10-CM | POA: Diagnosis not present

## 2023-10-18 DIAGNOSIS — J9690 Respiratory failure, unspecified, unspecified whether with hypoxia or hypercapnia: Secondary | ICD-10-CM | POA: Diagnosis not present

## 2023-10-18 DIAGNOSIS — Z79899 Other long term (current) drug therapy: Secondary | ICD-10-CM | POA: Diagnosis not present

## 2023-10-18 DIAGNOSIS — R0902 Hypoxemia: Secondary | ICD-10-CM | POA: Diagnosis not present

## 2023-10-18 DIAGNOSIS — K219 Gastro-esophageal reflux disease without esophagitis: Secondary | ICD-10-CM | POA: Diagnosis not present

## 2023-10-18 DIAGNOSIS — I959 Hypotension, unspecified: Secondary | ICD-10-CM | POA: Diagnosis not present

## 2023-10-18 DIAGNOSIS — I517 Cardiomegaly: Secondary | ICD-10-CM | POA: Diagnosis not present

## 2023-10-18 DIAGNOSIS — R9431 Abnormal electrocardiogram [ECG] [EKG]: Secondary | ICD-10-CM | POA: Diagnosis not present

## 2023-10-18 DIAGNOSIS — J9621 Acute and chronic respiratory failure with hypoxia: Secondary | ICD-10-CM | POA: Diagnosis not present

## 2023-10-18 DIAGNOSIS — I509 Heart failure, unspecified: Secondary | ICD-10-CM | POA: Diagnosis not present

## 2023-10-18 DIAGNOSIS — J90 Pleural effusion, not elsewhere classified: Secondary | ICD-10-CM | POA: Diagnosis not present

## 2023-10-18 DIAGNOSIS — I50811 Acute right heart failure: Secondary | ICD-10-CM | POA: Diagnosis not present

## 2023-10-18 DIAGNOSIS — R0689 Other abnormalities of breathing: Secondary | ICD-10-CM | POA: Diagnosis not present

## 2023-10-19 DIAGNOSIS — J9621 Acute and chronic respiratory failure with hypoxia: Secondary | ICD-10-CM | POA: Diagnosis not present

## 2023-10-19 DIAGNOSIS — G9341 Metabolic encephalopathy: Secondary | ICD-10-CM | POA: Diagnosis not present

## 2023-10-19 DIAGNOSIS — I509 Heart failure, unspecified: Secondary | ICD-10-CM | POA: Diagnosis not present

## 2023-10-19 DIAGNOSIS — N170 Acute kidney failure with tubular necrosis: Secondary | ICD-10-CM | POA: Diagnosis not present

## 2023-10-19 DIAGNOSIS — J984 Other disorders of lung: Secondary | ICD-10-CM | POA: Diagnosis not present

## 2023-10-20 DIAGNOSIS — G9341 Metabolic encephalopathy: Secondary | ICD-10-CM | POA: Diagnosis not present

## 2023-10-20 DIAGNOSIS — R0602 Shortness of breath: Secondary | ICD-10-CM | POA: Diagnosis not present

## 2023-10-20 DIAGNOSIS — J9621 Acute and chronic respiratory failure with hypoxia: Secondary | ICD-10-CM | POA: Diagnosis not present

## 2023-10-20 DIAGNOSIS — J984 Other disorders of lung: Secondary | ICD-10-CM | POA: Diagnosis not present

## 2023-10-20 DIAGNOSIS — N170 Acute kidney failure with tubular necrosis: Secondary | ICD-10-CM | POA: Diagnosis not present

## 2023-10-21 DIAGNOSIS — J984 Other disorders of lung: Secondary | ICD-10-CM | POA: Diagnosis not present

## 2023-10-21 DIAGNOSIS — G9341 Metabolic encephalopathy: Secondary | ICD-10-CM | POA: Diagnosis not present

## 2023-10-21 DIAGNOSIS — R0602 Shortness of breath: Secondary | ICD-10-CM | POA: Diagnosis not present

## 2023-10-21 DIAGNOSIS — J9621 Acute and chronic respiratory failure with hypoxia: Secondary | ICD-10-CM | POA: Diagnosis not present

## 2023-10-21 DIAGNOSIS — N170 Acute kidney failure with tubular necrosis: Secondary | ICD-10-CM | POA: Diagnosis not present

## 2023-10-22 DIAGNOSIS — N170 Acute kidney failure with tubular necrosis: Secondary | ICD-10-CM | POA: Diagnosis not present

## 2023-10-22 DIAGNOSIS — R0602 Shortness of breath: Secondary | ICD-10-CM | POA: Diagnosis not present

## 2023-10-22 DIAGNOSIS — J9621 Acute and chronic respiratory failure with hypoxia: Secondary | ICD-10-CM | POA: Diagnosis not present

## 2023-10-22 DIAGNOSIS — J984 Other disorders of lung: Secondary | ICD-10-CM | POA: Diagnosis not present

## 2023-10-22 DIAGNOSIS — G9341 Metabolic encephalopathy: Secondary | ICD-10-CM | POA: Diagnosis not present

## 2023-10-23 DIAGNOSIS — G9341 Metabolic encephalopathy: Secondary | ICD-10-CM | POA: Diagnosis not present

## 2023-10-23 DIAGNOSIS — N170 Acute kidney failure with tubular necrosis: Secondary | ICD-10-CM | POA: Diagnosis not present

## 2023-10-23 DIAGNOSIS — N179 Acute kidney failure, unspecified: Secondary | ICD-10-CM | POA: Diagnosis not present

## 2023-10-23 DIAGNOSIS — R918 Other nonspecific abnormal finding of lung field: Secondary | ICD-10-CM | POA: Diagnosis not present

## 2023-10-23 DIAGNOSIS — J9621 Acute and chronic respiratory failure with hypoxia: Secondary | ICD-10-CM | POA: Diagnosis not present

## 2023-10-24 DIAGNOSIS — J984 Other disorders of lung: Secondary | ICD-10-CM | POA: Diagnosis not present

## 2023-10-24 DIAGNOSIS — J9621 Acute and chronic respiratory failure with hypoxia: Secondary | ICD-10-CM | POA: Diagnosis not present

## 2023-10-24 DIAGNOSIS — N170 Acute kidney failure with tubular necrosis: Secondary | ICD-10-CM | POA: Diagnosis not present

## 2023-10-24 DIAGNOSIS — N179 Acute kidney failure, unspecified: Secondary | ICD-10-CM | POA: Diagnosis not present

## 2023-10-24 DIAGNOSIS — G9341 Metabolic encephalopathy: Secondary | ICD-10-CM | POA: Diagnosis not present

## 2023-10-24 DIAGNOSIS — R0602 Shortness of breath: Secondary | ICD-10-CM | POA: Diagnosis not present

## 2023-10-25 DIAGNOSIS — J9621 Acute and chronic respiratory failure with hypoxia: Secondary | ICD-10-CM | POA: Diagnosis not present

## 2023-10-25 DIAGNOSIS — N170 Acute kidney failure with tubular necrosis: Secondary | ICD-10-CM | POA: Diagnosis not present

## 2023-10-25 DIAGNOSIS — G9341 Metabolic encephalopathy: Secondary | ICD-10-CM | POA: Diagnosis not present

## 2023-10-25 DIAGNOSIS — N179 Acute kidney failure, unspecified: Secondary | ICD-10-CM | POA: Diagnosis not present

## 2023-10-25 DIAGNOSIS — J811 Chronic pulmonary edema: Secondary | ICD-10-CM | POA: Diagnosis not present

## 2023-10-25 DIAGNOSIS — I509 Heart failure, unspecified: Secondary | ICD-10-CM | POA: Diagnosis not present

## 2023-10-26 DIAGNOSIS — N170 Acute kidney failure with tubular necrosis: Secondary | ICD-10-CM | POA: Diagnosis not present

## 2023-10-26 DIAGNOSIS — N179 Acute kidney failure, unspecified: Secondary | ICD-10-CM | POA: Diagnosis not present

## 2023-10-26 DIAGNOSIS — G9341 Metabolic encephalopathy: Secondary | ICD-10-CM | POA: Diagnosis not present

## 2023-10-26 DIAGNOSIS — J984 Other disorders of lung: Secondary | ICD-10-CM | POA: Diagnosis not present

## 2023-10-26 DIAGNOSIS — J9621 Acute and chronic respiratory failure with hypoxia: Secondary | ICD-10-CM | POA: Diagnosis not present

## 2023-10-27 DIAGNOSIS — N170 Acute kidney failure with tubular necrosis: Secondary | ICD-10-CM | POA: Diagnosis not present

## 2023-10-27 DIAGNOSIS — G9341 Metabolic encephalopathy: Secondary | ICD-10-CM | POA: Diagnosis not present

## 2023-10-27 DIAGNOSIS — N179 Acute kidney failure, unspecified: Secondary | ICD-10-CM | POA: Diagnosis not present

## 2023-10-27 DIAGNOSIS — J9621 Acute and chronic respiratory failure with hypoxia: Secondary | ICD-10-CM | POA: Diagnosis not present

## 2023-10-28 DIAGNOSIS — G9341 Metabolic encephalopathy: Secondary | ICD-10-CM | POA: Diagnosis not present

## 2023-10-28 DIAGNOSIS — N179 Acute kidney failure, unspecified: Secondary | ICD-10-CM | POA: Diagnosis not present

## 2023-10-28 DIAGNOSIS — J9621 Acute and chronic respiratory failure with hypoxia: Secondary | ICD-10-CM | POA: Diagnosis not present

## 2023-10-28 DIAGNOSIS — N170 Acute kidney failure with tubular necrosis: Secondary | ICD-10-CM | POA: Diagnosis not present

## 2023-10-29 DIAGNOSIS — N170 Acute kidney failure with tubular necrosis: Secondary | ICD-10-CM | POA: Diagnosis not present

## 2023-10-29 DIAGNOSIS — J9621 Acute and chronic respiratory failure with hypoxia: Secondary | ICD-10-CM | POA: Diagnosis not present

## 2023-10-29 DIAGNOSIS — G9341 Metabolic encephalopathy: Secondary | ICD-10-CM | POA: Diagnosis not present

## 2023-10-29 DIAGNOSIS — N179 Acute kidney failure, unspecified: Secondary | ICD-10-CM | POA: Diagnosis not present

## 2023-10-30 DIAGNOSIS — J9621 Acute and chronic respiratory failure with hypoxia: Secondary | ICD-10-CM | POA: Diagnosis not present

## 2023-10-30 DIAGNOSIS — J90 Pleural effusion, not elsewhere classified: Secondary | ICD-10-CM | POA: Diagnosis not present

## 2023-10-30 DIAGNOSIS — N179 Acute kidney failure, unspecified: Secondary | ICD-10-CM | POA: Diagnosis not present

## 2023-10-30 DIAGNOSIS — G9341 Metabolic encephalopathy: Secondary | ICD-10-CM | POA: Diagnosis not present

## 2023-10-30 DIAGNOSIS — N170 Acute kidney failure with tubular necrosis: Secondary | ICD-10-CM | POA: Diagnosis not present

## 2023-10-31 DIAGNOSIS — N179 Acute kidney failure, unspecified: Secondary | ICD-10-CM | POA: Diagnosis not present

## 2023-10-31 DIAGNOSIS — G9341 Metabolic encephalopathy: Secondary | ICD-10-CM | POA: Diagnosis not present

## 2023-10-31 DIAGNOSIS — N170 Acute kidney failure with tubular necrosis: Secondary | ICD-10-CM | POA: Diagnosis not present

## 2023-10-31 DIAGNOSIS — J9621 Acute and chronic respiratory failure with hypoxia: Secondary | ICD-10-CM | POA: Diagnosis not present

## 2023-11-01 DIAGNOSIS — G9341 Metabolic encephalopathy: Secondary | ICD-10-CM | POA: Diagnosis not present

## 2023-11-01 DIAGNOSIS — J9621 Acute and chronic respiratory failure with hypoxia: Secondary | ICD-10-CM | POA: Diagnosis not present

## 2023-11-01 DIAGNOSIS — N170 Acute kidney failure with tubular necrosis: Secondary | ICD-10-CM | POA: Diagnosis not present

## 2023-11-02 ENCOUNTER — Encounter: Payer: Self-pay | Admitting: Internal Medicine

## 2023-11-02 DIAGNOSIS — J9621 Acute and chronic respiratory failure with hypoxia: Secondary | ICD-10-CM | POA: Diagnosis not present

## 2023-11-02 DIAGNOSIS — N170 Acute kidney failure with tubular necrosis: Secondary | ICD-10-CM | POA: Diagnosis not present

## 2023-11-02 DIAGNOSIS — G9341 Metabolic encephalopathy: Secondary | ICD-10-CM | POA: Diagnosis not present

## 2023-11-03 DIAGNOSIS — J9622 Acute and chronic respiratory failure with hypercapnia: Secondary | ICD-10-CM | POA: Diagnosis not present

## 2023-11-03 DIAGNOSIS — J9621 Acute and chronic respiratory failure with hypoxia: Secondary | ICD-10-CM | POA: Diagnosis not present

## 2023-11-03 DIAGNOSIS — G9341 Metabolic encephalopathy: Secondary | ICD-10-CM | POA: Diagnosis not present

## 2023-11-03 DIAGNOSIS — N170 Acute kidney failure with tubular necrosis: Secondary | ICD-10-CM | POA: Diagnosis not present

## 2023-11-18 ENCOUNTER — Ambulatory Visit: Payer: 59 | Admitting: Podiatry

## 2023-11-18 DIAGNOSIS — J209 Acute bronchitis, unspecified: Secondary | ICD-10-CM | POA: Diagnosis not present

## 2023-12-07 DIAGNOSIS — J9611 Chronic respiratory failure with hypoxia: Secondary | ICD-10-CM | POA: Diagnosis not present

## 2023-12-07 DIAGNOSIS — I50810 Right heart failure, unspecified: Secondary | ICD-10-CM | POA: Diagnosis not present

## 2023-12-07 DIAGNOSIS — R600 Localized edema: Secondary | ICD-10-CM | POA: Diagnosis not present

## 2023-12-07 DIAGNOSIS — Z79899 Other long term (current) drug therapy: Secondary | ICD-10-CM | POA: Diagnosis not present

## 2023-12-07 DIAGNOSIS — I10 Essential (primary) hypertension: Secondary | ICD-10-CM | POA: Diagnosis not present

## 2023-12-07 DIAGNOSIS — J449 Chronic obstructive pulmonary disease, unspecified: Secondary | ICD-10-CM | POA: Diagnosis not present

## 2023-12-07 DIAGNOSIS — R7303 Prediabetes: Secondary | ICD-10-CM | POA: Diagnosis not present

## 2023-12-07 DIAGNOSIS — N179 Acute kidney failure, unspecified: Secondary | ICD-10-CM | POA: Diagnosis not present

## 2023-12-07 DIAGNOSIS — E782 Mixed hyperlipidemia: Secondary | ICD-10-CM | POA: Diagnosis not present

## 2023-12-19 DIAGNOSIS — J209 Acute bronchitis, unspecified: Secondary | ICD-10-CM | POA: Diagnosis not present

## 2023-12-29 DIAGNOSIS — J479 Bronchiectasis, uncomplicated: Secondary | ICD-10-CM | POA: Diagnosis not present

## 2023-12-29 DIAGNOSIS — J449 Chronic obstructive pulmonary disease, unspecified: Secondary | ICD-10-CM | POA: Diagnosis not present

## 2023-12-29 DIAGNOSIS — R0602 Shortness of breath: Secondary | ICD-10-CM | POA: Diagnosis not present

## 2024-01-03 ENCOUNTER — Other Ambulatory Visit: Payer: Self-pay | Admitting: Internal Medicine

## 2024-01-03 DIAGNOSIS — F17200 Nicotine dependence, unspecified, uncomplicated: Secondary | ICD-10-CM | POA: Diagnosis not present

## 2024-01-03 DIAGNOSIS — R079 Chest pain, unspecified: Secondary | ICD-10-CM | POA: Diagnosis not present

## 2024-01-03 DIAGNOSIS — I10 Essential (primary) hypertension: Secondary | ICD-10-CM | POA: Diagnosis not present

## 2024-01-03 DIAGNOSIS — K219 Gastro-esophageal reflux disease without esophagitis: Secondary | ICD-10-CM | POA: Diagnosis not present

## 2024-01-03 DIAGNOSIS — J449 Chronic obstructive pulmonary disease, unspecified: Secondary | ICD-10-CM | POA: Diagnosis not present

## 2024-01-03 DIAGNOSIS — J432 Centrilobular emphysema: Secondary | ICD-10-CM | POA: Diagnosis not present

## 2024-01-03 DIAGNOSIS — R0602 Shortness of breath: Secondary | ICD-10-CM

## 2024-01-14 ENCOUNTER — Ambulatory Visit
Admission: RE | Admit: 2024-01-14 | Discharge: 2024-01-14 | Disposition: A | Discharge status: Home / Self Care | Payer: Self-pay | Source: Ambulatory Visit | Attending: Internal Medicine | Admitting: Internal Medicine

## 2024-01-14 DIAGNOSIS — R079 Chest pain, unspecified: Secondary | ICD-10-CM | POA: Insufficient documentation

## 2024-01-14 DIAGNOSIS — R0602 Shortness of breath: Secondary | ICD-10-CM | POA: Insufficient documentation

## 2024-01-16 DIAGNOSIS — J209 Acute bronchitis, unspecified: Secondary | ICD-10-CM | POA: Diagnosis not present

## 2024-01-17 DIAGNOSIS — R0602 Shortness of breath: Secondary | ICD-10-CM | POA: Diagnosis not present

## 2024-02-16 DIAGNOSIS — J209 Acute bronchitis, unspecified: Secondary | ICD-10-CM | POA: Diagnosis not present

## 2024-03-02 DIAGNOSIS — J479 Bronchiectasis, uncomplicated: Secondary | ICD-10-CM | POA: Diagnosis not present

## 2024-03-02 DIAGNOSIS — Z9981 Dependence on supplemental oxygen: Secondary | ICD-10-CM | POA: Diagnosis not present

## 2024-03-02 DIAGNOSIS — Z87891 Personal history of nicotine dependence: Secondary | ICD-10-CM | POA: Diagnosis not present

## 2024-03-02 DIAGNOSIS — J449 Chronic obstructive pulmonary disease, unspecified: Secondary | ICD-10-CM | POA: Diagnosis not present

## 2024-03-02 DIAGNOSIS — R0602 Shortness of breath: Secondary | ICD-10-CM | POA: Diagnosis not present

## 2024-03-08 DIAGNOSIS — J449 Chronic obstructive pulmonary disease, unspecified: Secondary | ICD-10-CM | POA: Diagnosis not present

## 2024-03-17 DIAGNOSIS — J209 Acute bronchitis, unspecified: Secondary | ICD-10-CM | POA: Diagnosis not present

## 2024-04-17 DIAGNOSIS — J209 Acute bronchitis, unspecified: Secondary | ICD-10-CM | POA: Diagnosis not present

## 2024-05-03 DIAGNOSIS — R079 Chest pain, unspecified: Secondary | ICD-10-CM | POA: Diagnosis not present

## 2024-05-03 DIAGNOSIS — J449 Chronic obstructive pulmonary disease, unspecified: Secondary | ICD-10-CM | POA: Diagnosis not present

## 2024-05-03 DIAGNOSIS — J432 Centrilobular emphysema: Secondary | ICD-10-CM | POA: Diagnosis not present

## 2024-05-03 DIAGNOSIS — R0602 Shortness of breath: Secondary | ICD-10-CM | POA: Diagnosis not present

## 2024-05-03 DIAGNOSIS — E782 Mixed hyperlipidemia: Secondary | ICD-10-CM | POA: Diagnosis not present

## 2024-05-03 DIAGNOSIS — J3489 Other specified disorders of nose and nasal sinuses: Secondary | ICD-10-CM | POA: Diagnosis not present

## 2024-05-03 DIAGNOSIS — I10 Essential (primary) hypertension: Secondary | ICD-10-CM | POA: Diagnosis not present

## 2024-05-03 DIAGNOSIS — K219 Gastro-esophageal reflux disease without esophagitis: Secondary | ICD-10-CM | POA: Diagnosis not present

## 2024-05-17 DIAGNOSIS — J209 Acute bronchitis, unspecified: Secondary | ICD-10-CM | POA: Diagnosis not present

## 2024-06-07 DIAGNOSIS — J449 Chronic obstructive pulmonary disease, unspecified: Secondary | ICD-10-CM | POA: Diagnosis not present

## 2024-06-07 DIAGNOSIS — Z9981 Dependence on supplemental oxygen: Secondary | ICD-10-CM | POA: Diagnosis not present

## 2024-06-07 DIAGNOSIS — R0602 Shortness of breath: Secondary | ICD-10-CM | POA: Diagnosis not present

## 2024-06-07 DIAGNOSIS — J811 Chronic pulmonary edema: Secondary | ICD-10-CM | POA: Diagnosis not present

## 2024-06-07 DIAGNOSIS — R053 Chronic cough: Secondary | ICD-10-CM | POA: Diagnosis not present

## 2024-06-09 DIAGNOSIS — I50810 Right heart failure, unspecified: Secondary | ICD-10-CM | POA: Diagnosis not present

## 2024-06-09 DIAGNOSIS — Z23 Encounter for immunization: Secondary | ICD-10-CM | POA: Diagnosis not present

## 2024-06-09 DIAGNOSIS — J9611 Chronic respiratory failure with hypoxia: Secondary | ICD-10-CM | POA: Diagnosis not present

## 2024-06-09 DIAGNOSIS — J449 Chronic obstructive pulmonary disease, unspecified: Secondary | ICD-10-CM | POA: Diagnosis not present

## 2024-06-09 DIAGNOSIS — E782 Mixed hyperlipidemia: Secondary | ICD-10-CM | POA: Diagnosis not present

## 2024-06-09 DIAGNOSIS — I10 Essential (primary) hypertension: Secondary | ICD-10-CM | POA: Diagnosis not present

## 2024-06-09 DIAGNOSIS — R600 Localized edema: Secondary | ICD-10-CM | POA: Diagnosis not present

## 2024-06-09 DIAGNOSIS — R7303 Prediabetes: Secondary | ICD-10-CM | POA: Diagnosis not present

## 2024-06-12 DIAGNOSIS — R1111 Vomiting without nausea: Secondary | ICD-10-CM | POA: Diagnosis not present

## 2024-06-12 DIAGNOSIS — I10 Essential (primary) hypertension: Secondary | ICD-10-CM | POA: Diagnosis not present

## 2024-06-12 DIAGNOSIS — R110 Nausea: Secondary | ICD-10-CM | POA: Diagnosis not present

## 2024-06-12 DIAGNOSIS — G9341 Metabolic encephalopathy: Secondary | ICD-10-CM | POA: Diagnosis not present

## 2024-06-12 DIAGNOSIS — R404 Transient alteration of awareness: Secondary | ICD-10-CM | POA: Diagnosis not present

## 2024-06-12 DIAGNOSIS — R092 Respiratory arrest: Secondary | ICD-10-CM | POA: Diagnosis not present

## 2024-06-12 DIAGNOSIS — E7800 Pure hypercholesterolemia, unspecified: Secondary | ICD-10-CM | POA: Diagnosis not present

## 2024-06-13 DIAGNOSIS — I252 Old myocardial infarction: Secondary | ICD-10-CM | POA: Diagnosis not present

## 2024-06-13 DIAGNOSIS — I10 Essential (primary) hypertension: Secondary | ICD-10-CM | POA: Diagnosis not present

## 2024-06-13 DIAGNOSIS — J9621 Acute and chronic respiratory failure with hypoxia: Secondary | ICD-10-CM | POA: Diagnosis not present

## 2024-06-13 DIAGNOSIS — Z87891 Personal history of nicotine dependence: Secondary | ICD-10-CM | POA: Diagnosis not present

## 2024-06-13 DIAGNOSIS — J440 Chronic obstructive pulmonary disease with acute lower respiratory infection: Secondary | ICD-10-CM | POA: Diagnosis not present

## 2024-06-13 DIAGNOSIS — G9341 Metabolic encephalopathy: Secondary | ICD-10-CM | POA: Diagnosis not present

## 2024-06-13 DIAGNOSIS — A419 Sepsis, unspecified organism: Secondary | ICD-10-CM | POA: Diagnosis not present

## 2024-06-13 DIAGNOSIS — J441 Chronic obstructive pulmonary disease with (acute) exacerbation: Secondary | ICD-10-CM | POA: Diagnosis not present

## 2024-06-13 DIAGNOSIS — I2510 Atherosclerotic heart disease of native coronary artery without angina pectoris: Secondary | ICD-10-CM | POA: Diagnosis not present

## 2024-06-13 DIAGNOSIS — Z79899 Other long term (current) drug therapy: Secondary | ICD-10-CM | POA: Diagnosis not present

## 2024-06-13 DIAGNOSIS — J9622 Acute and chronic respiratory failure with hypercapnia: Secondary | ICD-10-CM | POA: Diagnosis not present

## 2024-06-13 DIAGNOSIS — Z8673 Personal history of transient ischemic attack (TIA), and cerebral infarction without residual deficits: Secondary | ICD-10-CM | POA: Diagnosis not present

## 2024-06-13 DIAGNOSIS — E7800 Pure hypercholesterolemia, unspecified: Secondary | ICD-10-CM | POA: Diagnosis not present

## 2024-06-13 DIAGNOSIS — Z9981 Dependence on supplemental oxygen: Secondary | ICD-10-CM | POA: Diagnosis not present

## 2024-06-13 DIAGNOSIS — K219 Gastro-esophageal reflux disease without esophagitis: Secondary | ICD-10-CM | POA: Diagnosis not present

## 2024-06-13 DIAGNOSIS — Z7951 Long term (current) use of inhaled steroids: Secondary | ICD-10-CM | POA: Diagnosis not present

## 2024-06-13 DIAGNOSIS — Z8711 Personal history of peptic ulcer disease: Secondary | ICD-10-CM | POA: Diagnosis not present

## 2024-06-13 DIAGNOSIS — M1990 Unspecified osteoarthritis, unspecified site: Secondary | ICD-10-CM | POA: Diagnosis not present

## 2024-06-17 DIAGNOSIS — J209 Acute bronchitis, unspecified: Secondary | ICD-10-CM | POA: Diagnosis not present

## 2024-06-20 DIAGNOSIS — G9341 Metabolic encephalopathy: Secondary | ICD-10-CM | POA: Diagnosis not present

## 2024-06-20 DIAGNOSIS — J441 Chronic obstructive pulmonary disease with (acute) exacerbation: Secondary | ICD-10-CM | POA: Diagnosis not present

## 2024-06-20 DIAGNOSIS — J9601 Acute respiratory failure with hypoxia: Secondary | ICD-10-CM | POA: Diagnosis not present

## 2024-06-20 DIAGNOSIS — K219 Gastro-esophageal reflux disease without esophagitis: Secondary | ICD-10-CM | POA: Diagnosis not present

## 2024-06-21 DIAGNOSIS — J441 Chronic obstructive pulmonary disease with (acute) exacerbation: Secondary | ICD-10-CM | POA: Diagnosis not present

## 2024-06-21 DIAGNOSIS — G9341 Metabolic encephalopathy: Secondary | ICD-10-CM | POA: Diagnosis not present

## 2024-06-21 DIAGNOSIS — I509 Heart failure, unspecified: Secondary | ICD-10-CM | POA: Diagnosis not present

## 2024-06-21 DIAGNOSIS — J9601 Acute respiratory failure with hypoxia: Secondary | ICD-10-CM | POA: Diagnosis not present

## 2024-06-21 DIAGNOSIS — K219 Gastro-esophageal reflux disease without esophagitis: Secondary | ICD-10-CM | POA: Diagnosis not present

## 2024-06-21 DIAGNOSIS — J9690 Respiratory failure, unspecified, unspecified whether with hypoxia or hypercapnia: Secondary | ICD-10-CM | POA: Diagnosis not present

## 2024-06-22 DIAGNOSIS — G9341 Metabolic encephalopathy: Secondary | ICD-10-CM | POA: Diagnosis not present

## 2024-06-22 DIAGNOSIS — J441 Chronic obstructive pulmonary disease with (acute) exacerbation: Secondary | ICD-10-CM | POA: Diagnosis not present

## 2024-06-22 DIAGNOSIS — J9601 Acute respiratory failure with hypoxia: Secondary | ICD-10-CM | POA: Diagnosis not present

## 2024-06-22 DIAGNOSIS — K219 Gastro-esophageal reflux disease without esophagitis: Secondary | ICD-10-CM | POA: Diagnosis not present

## 2024-06-29 DIAGNOSIS — E119 Type 2 diabetes mellitus without complications: Secondary | ICD-10-CM | POA: Diagnosis not present

## 2024-06-29 DIAGNOSIS — R531 Weakness: Secondary | ICD-10-CM | POA: Diagnosis not present

## 2024-06-29 DIAGNOSIS — Z79899 Other long term (current) drug therapy: Secondary | ICD-10-CM | POA: Diagnosis not present

## 2024-06-29 DIAGNOSIS — Z8673 Personal history of transient ischemic attack (TIA), and cerebral infarction without residual deficits: Secondary | ICD-10-CM | POA: Diagnosis not present

## 2024-06-29 DIAGNOSIS — Z87891 Personal history of nicotine dependence: Secondary | ICD-10-CM | POA: Diagnosis not present

## 2024-06-29 DIAGNOSIS — I252 Old myocardial infarction: Secondary | ICD-10-CM | POA: Diagnosis not present

## 2024-06-29 DIAGNOSIS — M1990 Unspecified osteoarthritis, unspecified site: Secondary | ICD-10-CM | POA: Diagnosis not present

## 2024-06-29 DIAGNOSIS — R0602 Shortness of breath: Secondary | ICD-10-CM | POA: Diagnosis not present

## 2024-06-29 DIAGNOSIS — R5381 Other malaise: Secondary | ICD-10-CM | POA: Diagnosis not present

## 2024-06-29 DIAGNOSIS — K219 Gastro-esophageal reflux disease without esophagitis: Secondary | ICD-10-CM | POA: Diagnosis not present

## 2024-06-29 DIAGNOSIS — E7800 Pure hypercholesterolemia, unspecified: Secondary | ICD-10-CM | POA: Diagnosis not present

## 2024-06-29 DIAGNOSIS — J440 Chronic obstructive pulmonary disease with acute lower respiratory infection: Secondary | ICD-10-CM | POA: Diagnosis not present

## 2024-06-29 DIAGNOSIS — I10 Essential (primary) hypertension: Secondary | ICD-10-CM | POA: Diagnosis not present

## 2024-06-29 DIAGNOSIS — J449 Chronic obstructive pulmonary disease, unspecified: Secondary | ICD-10-CM | POA: Diagnosis not present

## 2024-06-29 DIAGNOSIS — R9431 Abnormal electrocardiogram [ECG] [EKG]: Secondary | ICD-10-CM | POA: Diagnosis not present

## 2024-06-29 DIAGNOSIS — R627 Adult failure to thrive: Secondary | ICD-10-CM | POA: Diagnosis not present

## 2024-06-29 DIAGNOSIS — Z792 Long term (current) use of antibiotics: Secondary | ICD-10-CM | POA: Diagnosis not present

## 2024-06-30 DIAGNOSIS — R627 Adult failure to thrive: Secondary | ICD-10-CM | POA: Diagnosis not present

## 2024-06-30 DIAGNOSIS — R531 Weakness: Secondary | ICD-10-CM | POA: Diagnosis not present

## 2024-06-30 DIAGNOSIS — R5381 Other malaise: Secondary | ICD-10-CM | POA: Diagnosis not present

## 2024-07-01 DIAGNOSIS — R627 Adult failure to thrive: Secondary | ICD-10-CM | POA: Diagnosis not present

## 2024-07-01 DIAGNOSIS — R531 Weakness: Secondary | ICD-10-CM | POA: Diagnosis not present

## 2024-07-01 DIAGNOSIS — R5381 Other malaise: Secondary | ICD-10-CM | POA: Diagnosis not present

## 2024-07-02 DIAGNOSIS — R627 Adult failure to thrive: Secondary | ICD-10-CM | POA: Diagnosis not present

## 2024-07-02 DIAGNOSIS — R5381 Other malaise: Secondary | ICD-10-CM | POA: Diagnosis not present

## 2024-07-02 DIAGNOSIS — R531 Weakness: Secondary | ICD-10-CM | POA: Diagnosis not present

## 2024-07-03 DIAGNOSIS — I10 Essential (primary) hypertension: Secondary | ICD-10-CM | POA: Diagnosis not present

## 2024-07-03 DIAGNOSIS — J9620 Acute and chronic respiratory failure, unspecified whether with hypoxia or hypercapnia: Secondary | ICD-10-CM | POA: Diagnosis not present

## 2024-07-03 DIAGNOSIS — J9610 Chronic respiratory failure, unspecified whether with hypoxia or hypercapnia: Secondary | ICD-10-CM | POA: Diagnosis not present

## 2024-07-03 DIAGNOSIS — J449 Chronic obstructive pulmonary disease, unspecified: Secondary | ICD-10-CM | POA: Diagnosis not present

## 2024-07-03 DIAGNOSIS — Z79899 Other long term (current) drug therapy: Secondary | ICD-10-CM | POA: Diagnosis not present

## 2024-07-03 DIAGNOSIS — I252 Old myocardial infarction: Secondary | ICD-10-CM | POA: Diagnosis not present

## 2024-07-03 DIAGNOSIS — K219 Gastro-esophageal reflux disease without esophagitis: Secondary | ICD-10-CM | POA: Diagnosis not present

## 2024-07-03 DIAGNOSIS — Z7401 Bed confinement status: Secondary | ICD-10-CM | POA: Diagnosis not present

## 2024-07-03 DIAGNOSIS — E7800 Pure hypercholesterolemia, unspecified: Secondary | ICD-10-CM | POA: Diagnosis not present

## 2024-07-03 DIAGNOSIS — E119 Type 2 diabetes mellitus without complications: Secondary | ICD-10-CM | POA: Diagnosis not present

## 2024-07-03 DIAGNOSIS — R0902 Hypoxemia: Secondary | ICD-10-CM | POA: Diagnosis not present

## 2024-07-03 DIAGNOSIS — Z792 Long term (current) use of antibiotics: Secondary | ICD-10-CM | POA: Diagnosis not present

## 2024-07-03 DIAGNOSIS — R531 Weakness: Secondary | ICD-10-CM | POA: Diagnosis not present

## 2024-07-03 DIAGNOSIS — E785 Hyperlipidemia, unspecified: Secondary | ICD-10-CM | POA: Diagnosis not present

## 2024-07-03 DIAGNOSIS — Z8673 Personal history of transient ischemic attack (TIA), and cerebral infarction without residual deficits: Secondary | ICD-10-CM | POA: Diagnosis not present

## 2024-07-03 DIAGNOSIS — M6259 Muscle wasting and atrophy, not elsewhere classified, multiple sites: Secondary | ICD-10-CM | POA: Diagnosis not present

## 2024-07-03 DIAGNOSIS — R627 Adult failure to thrive: Secondary | ICD-10-CM | POA: Diagnosis not present

## 2024-07-03 DIAGNOSIS — R1311 Dysphagia, oral phase: Secondary | ICD-10-CM | POA: Diagnosis not present

## 2024-07-03 DIAGNOSIS — I2510 Atherosclerotic heart disease of native coronary artery without angina pectoris: Secondary | ICD-10-CM | POA: Diagnosis not present

## 2024-07-03 DIAGNOSIS — I509 Heart failure, unspecified: Secondary | ICD-10-CM | POA: Diagnosis not present

## 2024-07-03 DIAGNOSIS — R5381 Other malaise: Secondary | ICD-10-CM | POA: Diagnosis not present

## 2024-07-03 DIAGNOSIS — Z7409 Other reduced mobility: Secondary | ICD-10-CM | POA: Diagnosis not present

## 2024-07-03 DIAGNOSIS — Z743 Need for continuous supervision: Secondary | ICD-10-CM | POA: Diagnosis not present

## 2024-07-03 DIAGNOSIS — J440 Chronic obstructive pulmonary disease with acute lower respiratory infection: Secondary | ICD-10-CM | POA: Diagnosis not present

## 2024-07-03 DIAGNOSIS — M1990 Unspecified osteoarthritis, unspecified site: Secondary | ICD-10-CM | POA: Diagnosis not present

## 2024-07-03 DIAGNOSIS — R2681 Unsteadiness on feet: Secondary | ICD-10-CM | POA: Diagnosis not present

## 2024-07-03 DIAGNOSIS — Z87891 Personal history of nicotine dependence: Secondary | ICD-10-CM | POA: Diagnosis not present

## 2024-07-04 DIAGNOSIS — J9620 Acute and chronic respiratory failure, unspecified whether with hypoxia or hypercapnia: Secondary | ICD-10-CM | POA: Diagnosis not present

## 2024-07-04 DIAGNOSIS — J449 Chronic obstructive pulmonary disease, unspecified: Secondary | ICD-10-CM | POA: Diagnosis not present

## 2024-07-04 DIAGNOSIS — M6259 Muscle wasting and atrophy, not elsewhere classified, multiple sites: Secondary | ICD-10-CM | POA: Diagnosis not present

## 2024-07-04 DIAGNOSIS — R5381 Other malaise: Secondary | ICD-10-CM | POA: Diagnosis not present

## 2024-07-05 DIAGNOSIS — I2510 Atherosclerotic heart disease of native coronary artery without angina pectoris: Secondary | ICD-10-CM | POA: Diagnosis not present

## 2024-07-05 DIAGNOSIS — J9610 Chronic respiratory failure, unspecified whether with hypoxia or hypercapnia: Secondary | ICD-10-CM | POA: Diagnosis not present

## 2024-07-05 DIAGNOSIS — R5381 Other malaise: Secondary | ICD-10-CM | POA: Diagnosis not present

## 2024-07-05 DIAGNOSIS — J449 Chronic obstructive pulmonary disease, unspecified: Secondary | ICD-10-CM | POA: Diagnosis not present

## 2024-07-11 DIAGNOSIS — J449 Chronic obstructive pulmonary disease, unspecified: Secondary | ICD-10-CM | POA: Diagnosis not present

## 2024-07-11 DIAGNOSIS — I509 Heart failure, unspecified: Secondary | ICD-10-CM | POA: Diagnosis not present

## 2024-07-11 DIAGNOSIS — J9610 Chronic respiratory failure, unspecified whether with hypoxia or hypercapnia: Secondary | ICD-10-CM | POA: Diagnosis not present

## 2024-07-14 DIAGNOSIS — I509 Heart failure, unspecified: Secondary | ICD-10-CM | POA: Diagnosis not present

## 2024-07-14 DIAGNOSIS — M6259 Muscle wasting and atrophy, not elsewhere classified, multiple sites: Secondary | ICD-10-CM | POA: Diagnosis not present

## 2024-07-14 DIAGNOSIS — J9610 Chronic respiratory failure, unspecified whether with hypoxia or hypercapnia: Secondary | ICD-10-CM | POA: Diagnosis not present

## 2024-07-14 DIAGNOSIS — J449 Chronic obstructive pulmonary disease, unspecified: Secondary | ICD-10-CM | POA: Diagnosis not present

## 2024-07-19 DIAGNOSIS — I509 Heart failure, unspecified: Secondary | ICD-10-CM | POA: Diagnosis not present

## 2024-07-19 DIAGNOSIS — Z9981 Dependence on supplemental oxygen: Secondary | ICD-10-CM | POA: Diagnosis not present

## 2024-07-19 DIAGNOSIS — Z9181 History of falling: Secondary | ICD-10-CM | POA: Diagnosis not present

## 2024-07-19 DIAGNOSIS — M6259 Muscle wasting and atrophy, not elsewhere classified, multiple sites: Secondary | ICD-10-CM | POA: Diagnosis not present

## 2024-07-19 DIAGNOSIS — E785 Hyperlipidemia, unspecified: Secondary | ICD-10-CM | POA: Diagnosis not present

## 2024-07-19 DIAGNOSIS — Z87891 Personal history of nicotine dependence: Secondary | ICD-10-CM | POA: Diagnosis not present

## 2024-07-19 DIAGNOSIS — I110 Hypertensive heart disease with heart failure: Secondary | ICD-10-CM | POA: Diagnosis not present

## 2024-07-19 DIAGNOSIS — R5381 Other malaise: Secondary | ICD-10-CM | POA: Diagnosis not present

## 2024-07-19 DIAGNOSIS — I2510 Atherosclerotic heart disease of native coronary artery without angina pectoris: Secondary | ICD-10-CM | POA: Diagnosis not present

## 2024-07-19 DIAGNOSIS — J9620 Acute and chronic respiratory failure, unspecified whether with hypoxia or hypercapnia: Secondary | ICD-10-CM | POA: Diagnosis not present

## 2024-07-19 DIAGNOSIS — J449 Chronic obstructive pulmonary disease, unspecified: Secondary | ICD-10-CM | POA: Diagnosis not present

## 2024-07-19 DIAGNOSIS — R627 Adult failure to thrive: Secondary | ICD-10-CM | POA: Diagnosis not present

## 2024-07-20 ENCOUNTER — Ambulatory Visit: Admitting: Dermatology

## 2024-07-26 DIAGNOSIS — I50810 Right heart failure, unspecified: Secondary | ICD-10-CM | POA: Diagnosis not present

## 2024-07-26 DIAGNOSIS — Z9181 History of falling: Secondary | ICD-10-CM | POA: Diagnosis not present

## 2024-07-26 DIAGNOSIS — J9611 Chronic respiratory failure with hypoxia: Secondary | ICD-10-CM | POA: Diagnosis not present

## 2024-07-26 DIAGNOSIS — Z139 Encounter for screening, unspecified: Secondary | ICD-10-CM | POA: Diagnosis not present

## 2024-07-26 DIAGNOSIS — J449 Chronic obstructive pulmonary disease, unspecified: Secondary | ICD-10-CM | POA: Diagnosis not present

## 2024-07-26 DIAGNOSIS — R600 Localized edema: Secondary | ICD-10-CM | POA: Diagnosis not present

## 2024-07-26 DIAGNOSIS — Z79899 Other long term (current) drug therapy: Secondary | ICD-10-CM | POA: Diagnosis not present

## 2024-07-28 DIAGNOSIS — R627 Adult failure to thrive: Secondary | ICD-10-CM | POA: Diagnosis not present

## 2024-07-28 DIAGNOSIS — J9620 Acute and chronic respiratory failure, unspecified whether with hypoxia or hypercapnia: Secondary | ICD-10-CM | POA: Diagnosis not present

## 2024-07-28 DIAGNOSIS — M6259 Muscle wasting and atrophy, not elsewhere classified, multiple sites: Secondary | ICD-10-CM | POA: Diagnosis not present

## 2024-07-28 DIAGNOSIS — I509 Heart failure, unspecified: Secondary | ICD-10-CM | POA: Diagnosis not present

## 2024-07-28 DIAGNOSIS — Z87891 Personal history of nicotine dependence: Secondary | ICD-10-CM | POA: Diagnosis not present

## 2024-07-28 DIAGNOSIS — R5381 Other malaise: Secondary | ICD-10-CM | POA: Diagnosis not present

## 2024-07-28 DIAGNOSIS — E785 Hyperlipidemia, unspecified: Secondary | ICD-10-CM | POA: Diagnosis not present

## 2024-07-28 DIAGNOSIS — Z9981 Dependence on supplemental oxygen: Secondary | ICD-10-CM | POA: Diagnosis not present

## 2024-07-28 DIAGNOSIS — I110 Hypertensive heart disease with heart failure: Secondary | ICD-10-CM | POA: Diagnosis not present

## 2024-07-28 DIAGNOSIS — Z9181 History of falling: Secondary | ICD-10-CM | POA: Diagnosis not present

## 2024-07-28 DIAGNOSIS — J449 Chronic obstructive pulmonary disease, unspecified: Secondary | ICD-10-CM | POA: Diagnosis not present

## 2024-07-28 DIAGNOSIS — I2510 Atherosclerotic heart disease of native coronary artery without angina pectoris: Secondary | ICD-10-CM | POA: Diagnosis not present

## 2024-07-31 DIAGNOSIS — R5381 Other malaise: Secondary | ICD-10-CM | POA: Diagnosis not present

## 2024-07-31 DIAGNOSIS — R627 Adult failure to thrive: Secondary | ICD-10-CM | POA: Diagnosis not present

## 2024-07-31 DIAGNOSIS — I2510 Atherosclerotic heart disease of native coronary artery without angina pectoris: Secondary | ICD-10-CM | POA: Diagnosis not present

## 2024-07-31 DIAGNOSIS — Z9181 History of falling: Secondary | ICD-10-CM | POA: Diagnosis not present

## 2024-07-31 DIAGNOSIS — Z87891 Personal history of nicotine dependence: Secondary | ICD-10-CM | POA: Diagnosis not present

## 2024-07-31 DIAGNOSIS — M6259 Muscle wasting and atrophy, not elsewhere classified, multiple sites: Secondary | ICD-10-CM | POA: Diagnosis not present

## 2024-07-31 DIAGNOSIS — J449 Chronic obstructive pulmonary disease, unspecified: Secondary | ICD-10-CM | POA: Diagnosis not present

## 2024-07-31 DIAGNOSIS — I110 Hypertensive heart disease with heart failure: Secondary | ICD-10-CM | POA: Diagnosis not present

## 2024-07-31 DIAGNOSIS — E785 Hyperlipidemia, unspecified: Secondary | ICD-10-CM | POA: Diagnosis not present

## 2024-07-31 DIAGNOSIS — J9620 Acute and chronic respiratory failure, unspecified whether with hypoxia or hypercapnia: Secondary | ICD-10-CM | POA: Diagnosis not present

## 2024-07-31 DIAGNOSIS — Z9981 Dependence on supplemental oxygen: Secondary | ICD-10-CM | POA: Diagnosis not present

## 2024-07-31 DIAGNOSIS — I509 Heart failure, unspecified: Secondary | ICD-10-CM | POA: Diagnosis not present

## 2024-08-04 DIAGNOSIS — Z9181 History of falling: Secondary | ICD-10-CM | POA: Diagnosis not present

## 2024-08-04 DIAGNOSIS — M6259 Muscle wasting and atrophy, not elsewhere classified, multiple sites: Secondary | ICD-10-CM | POA: Diagnosis not present

## 2024-08-04 DIAGNOSIS — Z9981 Dependence on supplemental oxygen: Secondary | ICD-10-CM | POA: Diagnosis not present

## 2024-08-04 DIAGNOSIS — Z87891 Personal history of nicotine dependence: Secondary | ICD-10-CM | POA: Diagnosis not present

## 2024-08-04 DIAGNOSIS — J449 Chronic obstructive pulmonary disease, unspecified: Secondary | ICD-10-CM | POA: Diagnosis not present

## 2024-08-04 DIAGNOSIS — I509 Heart failure, unspecified: Secondary | ICD-10-CM | POA: Diagnosis not present

## 2024-08-04 DIAGNOSIS — R5381 Other malaise: Secondary | ICD-10-CM | POA: Diagnosis not present

## 2024-08-04 DIAGNOSIS — E785 Hyperlipidemia, unspecified: Secondary | ICD-10-CM | POA: Diagnosis not present

## 2024-08-04 DIAGNOSIS — I2510 Atherosclerotic heart disease of native coronary artery without angina pectoris: Secondary | ICD-10-CM | POA: Diagnosis not present

## 2024-08-04 DIAGNOSIS — R627 Adult failure to thrive: Secondary | ICD-10-CM | POA: Diagnosis not present

## 2024-08-04 DIAGNOSIS — I110 Hypertensive heart disease with heart failure: Secondary | ICD-10-CM | POA: Diagnosis not present

## 2024-08-04 DIAGNOSIS — J9620 Acute and chronic respiratory failure, unspecified whether with hypoxia or hypercapnia: Secondary | ICD-10-CM | POA: Diagnosis not present

## 2024-08-07 DIAGNOSIS — E785 Hyperlipidemia, unspecified: Secondary | ICD-10-CM | POA: Diagnosis not present

## 2024-08-07 DIAGNOSIS — Z87891 Personal history of nicotine dependence: Secondary | ICD-10-CM | POA: Diagnosis not present

## 2024-08-07 DIAGNOSIS — M6259 Muscle wasting and atrophy, not elsewhere classified, multiple sites: Secondary | ICD-10-CM | POA: Diagnosis not present

## 2024-08-07 DIAGNOSIS — Z9981 Dependence on supplemental oxygen: Secondary | ICD-10-CM | POA: Diagnosis not present

## 2024-08-07 DIAGNOSIS — R5381 Other malaise: Secondary | ICD-10-CM | POA: Diagnosis not present

## 2024-08-07 DIAGNOSIS — R627 Adult failure to thrive: Secondary | ICD-10-CM | POA: Diagnosis not present

## 2024-08-07 DIAGNOSIS — J449 Chronic obstructive pulmonary disease, unspecified: Secondary | ICD-10-CM | POA: Diagnosis not present

## 2024-08-07 DIAGNOSIS — Z9181 History of falling: Secondary | ICD-10-CM | POA: Diagnosis not present

## 2024-08-07 DIAGNOSIS — J9620 Acute and chronic respiratory failure, unspecified whether with hypoxia or hypercapnia: Secondary | ICD-10-CM | POA: Diagnosis not present

## 2024-08-07 DIAGNOSIS — I509 Heart failure, unspecified: Secondary | ICD-10-CM | POA: Diagnosis not present

## 2024-08-07 DIAGNOSIS — I2510 Atherosclerotic heart disease of native coronary artery without angina pectoris: Secondary | ICD-10-CM | POA: Diagnosis not present

## 2024-08-07 DIAGNOSIS — I110 Hypertensive heart disease with heart failure: Secondary | ICD-10-CM | POA: Diagnosis not present

## 2024-08-09 DIAGNOSIS — R627 Adult failure to thrive: Secondary | ICD-10-CM | POA: Diagnosis not present

## 2024-08-09 DIAGNOSIS — J449 Chronic obstructive pulmonary disease, unspecified: Secondary | ICD-10-CM | POA: Diagnosis not present

## 2024-08-09 DIAGNOSIS — I2510 Atherosclerotic heart disease of native coronary artery without angina pectoris: Secondary | ICD-10-CM | POA: Diagnosis not present

## 2024-08-09 DIAGNOSIS — I509 Heart failure, unspecified: Secondary | ICD-10-CM | POA: Diagnosis not present

## 2024-08-09 DIAGNOSIS — R5381 Other malaise: Secondary | ICD-10-CM | POA: Diagnosis not present

## 2024-08-09 DIAGNOSIS — Z9981 Dependence on supplemental oxygen: Secondary | ICD-10-CM | POA: Diagnosis not present

## 2024-08-09 DIAGNOSIS — J9620 Acute and chronic respiratory failure, unspecified whether with hypoxia or hypercapnia: Secondary | ICD-10-CM | POA: Diagnosis not present

## 2024-08-09 DIAGNOSIS — M6259 Muscle wasting and atrophy, not elsewhere classified, multiple sites: Secondary | ICD-10-CM | POA: Diagnosis not present

## 2024-08-09 DIAGNOSIS — E785 Hyperlipidemia, unspecified: Secondary | ICD-10-CM | POA: Diagnosis not present

## 2024-08-09 DIAGNOSIS — Z9181 History of falling: Secondary | ICD-10-CM | POA: Diagnosis not present

## 2024-08-09 DIAGNOSIS — Z87891 Personal history of nicotine dependence: Secondary | ICD-10-CM | POA: Diagnosis not present

## 2024-08-09 DIAGNOSIS — I110 Hypertensive heart disease with heart failure: Secondary | ICD-10-CM | POA: Diagnosis not present

## 2024-08-17 DIAGNOSIS — J209 Acute bronchitis, unspecified: Secondary | ICD-10-CM | POA: Diagnosis not present

## 2024-09-06 ENCOUNTER — Ambulatory Visit (INDEPENDENT_AMBULATORY_CARE_PROVIDER_SITE_OTHER): Admitting: Podiatry

## 2024-09-06 DIAGNOSIS — L853 Xerosis cutis: Secondary | ICD-10-CM

## 2024-09-06 DIAGNOSIS — M79674 Pain in right toe(s): Secondary | ICD-10-CM | POA: Diagnosis not present

## 2024-09-06 DIAGNOSIS — M79675 Pain in left toe(s): Secondary | ICD-10-CM

## 2024-09-06 DIAGNOSIS — B351 Tinea unguium: Secondary | ICD-10-CM

## 2024-09-06 MED ORDER — AMMONIUM LACTATE 12 % EX LOTN: 1.0000 | TOPICAL_LOTION | CUTANEOUS | 5 refills | Status: AC | PRN

## 2024-09-06 NOTE — Progress Notes (Unsigned)
       Subjective:  Patient ID: Angela Pitts, female    DOB: 1956-09-06,  MRN: [id number]  Angela Pitts presents to clinic today for:  Chief Complaint  Patient presents with   [facility]    Not diabetic. No anticoag. Right 1st nail did break off, no pain or redness.    Patient notes nails are thick, discolored, elongated and painful in shoegear when trying to ambulate.  Her son is with her today.  She arrived in a wheelchair and using supplemental oxygen .  Notes her feet are very sensitive  PCP is Hamrick, Charlene LITTIE, MD.  Past Medical History:  Diagnosis Date   Allergic rhinitis    Benign essential hypertension    COPD (chronic obstructive pulmonary disease) (HCC)    GERD (gastroesophageal reflux disease)    Mixed hyperlipidemia    Osteopenia    Prediabetes    Past Surgical History:  Procedure Laterality Date   BLADDER SUSPENSION     CHOLECYSTECTOMY     EYE SURGERY     left hand reconstruction     LIPOMA EXCISION Right    back area   No Known Allergies  Review of Systems: Negative except as noted in the HPI.  Objective:  Angela Pitts is a pleasant 68 y.o. female in NAD. AAO x 3.  Vascular Examination: Capillary refill time is 3-5 seconds to toes bilateral.  1/4 palpable pedal pulses b/l LE. Digital hair sparse b/l.  Skin temperature gradient WNL b/l.   Dermatological Examination: Pedal skin dry and flaky bilateral.  Skin is shiny and atrophic.  No open wounds. No interdigital macerations b/l. Toenails x10 are 3mm thick, discolored, dystrophic with subungual debris. There is pain with compression of the nail plates.  They are elongated x10  Assessment/Plan: 1. Pain due to onychomycosis of toenails of both feet   2. Xerosis of skin     Meds ordered this encounter  Medications   ammonium lactate (LAC-HYDRIN) 12 % lotion    Sig: Apply 1 Application topically as needed for dry skin.    Dispense:  400 g    Refill:  5   Discussed the dry skin today.  Informed  the patient that if the skin stays very dry it is prone to small cracks which could allow bacteria to get in and cause cellulitis.  Recommend ammonium lactate 12% lotion to be applied to the skin once to twice daily.  This prescription was sent to her pharmacy.  The mycotic toenails were sharply debrided x10 with sterile nail nippers and a power debriding burr to decrease bulk/thickness and length.    Return in about 3 months (around 12/07/2024) for RFC.   Awanda CHARM Imperial, DPM, FACFAS [facility]     [address] [place], [place] [address]                Office [phone]  Fax [phone]

## 2024-12-04 ENCOUNTER — Ambulatory Visit: Admitting: Podiatry
